# Patient Record
Sex: Male | Born: 1975 | Race: Asian | Hispanic: No | Marital: Married | Smoking: Never smoker
Health system: Southern US, Community
[De-identification: ages and names within clinical notes are randomized; demographics above are authoritative.]

## PROBLEM LIST (undated history)

## (undated) DIAGNOSIS — I10 Essential (primary) hypertension: Secondary | ICD-10-CM

## (undated) DIAGNOSIS — K219 Gastro-esophageal reflux disease without esophagitis: Secondary | ICD-10-CM

## (undated) DIAGNOSIS — K760 Fatty (change of) liver, not elsewhere classified: Secondary | ICD-10-CM

---

## 2011-03-05 ENCOUNTER — Ambulatory Visit (INDEPENDENT_AMBULATORY_CARE_PROVIDER_SITE_OTHER): Payer: BC Managed Care – PPO

## 2011-03-05 DIAGNOSIS — N476 Balanoposthitis: Secondary | ICD-10-CM

## 2011-03-05 DIAGNOSIS — I10 Essential (primary) hypertension: Secondary | ICD-10-CM

## 2011-06-30 ENCOUNTER — Other Ambulatory Visit: Payer: Self-pay | Admitting: Family Medicine

## 2011-09-02 ENCOUNTER — Other Ambulatory Visit: Payer: Self-pay | Admitting: Physician Assistant

## 2011-11-02 ENCOUNTER — Emergency Department (HOSPITAL_COMMUNITY): Payer: BC Managed Care – PPO

## 2011-11-02 ENCOUNTER — Encounter (HOSPITAL_COMMUNITY): Payer: Self-pay | Admitting: *Deleted

## 2011-11-02 ENCOUNTER — Observation Stay (HOSPITAL_COMMUNITY)
Admission: EM | Admit: 2011-11-02 | Discharge: 2011-11-03 | Disposition: A | Discharge status: Home / Self Care | Payer: BC Managed Care – PPO | Attending: Cardiovascular Disease | Admitting: Cardiovascular Disease

## 2011-11-02 ENCOUNTER — Ambulatory Visit (INDEPENDENT_AMBULATORY_CARE_PROVIDER_SITE_OTHER): Payer: BC Managed Care – PPO | Admitting: Emergency Medicine

## 2011-11-02 VITALS — BP 158/100 | HR 69 | Temp 97.8°F | Resp 18 | Ht 60.0 in | Wt 142.0 lb

## 2011-11-02 DIAGNOSIS — I10 Essential (primary) hypertension: Secondary | ICD-10-CM | POA: Insufficient documentation

## 2011-11-02 DIAGNOSIS — R0602 Shortness of breath: Secondary | ICD-10-CM | POA: Insufficient documentation

## 2011-11-02 DIAGNOSIS — K219 Gastro-esophageal reflux disease without esophagitis: Secondary | ICD-10-CM | POA: Diagnosis present

## 2011-11-02 DIAGNOSIS — R079 Chest pain, unspecified: Secondary | ICD-10-CM

## 2011-11-02 DIAGNOSIS — K760 Fatty (change of) liver, not elsewhere classified: Secondary | ICD-10-CM | POA: Diagnosis present

## 2011-11-02 DIAGNOSIS — I200 Unstable angina: Secondary | ICD-10-CM

## 2011-11-02 DIAGNOSIS — K7689 Other specified diseases of liver: Secondary | ICD-10-CM | POA: Insufficient documentation

## 2011-11-02 HISTORY — DX: Essential (primary) hypertension: I10

## 2011-11-02 HISTORY — DX: Gastro-esophageal reflux disease without esophagitis: K21.9

## 2011-11-02 HISTORY — DX: Fatty (change of) liver, not elsewhere classified: K76.0

## 2011-11-02 LAB — CBC WITH DIFFERENTIAL/PLATELET
Lymphocytes Relative: 22 % (ref 12–46)
Lymphs Abs: 1.8 10*3/uL (ref 0.7–4.0)
MCV: 79.8 fL (ref 78.0–100.0)
Neutro Abs: 5.4 10*3/uL (ref 1.7–7.7)
Neutrophils Relative %: 66 % (ref 43–77)
Platelets: 233 10*3/uL (ref 150–400)
RBC: 5.14 MIL/uL (ref 4.22–5.81)
WBC: 8.2 10*3/uL (ref 4.0–10.5)

## 2011-11-02 LAB — CK TOTAL AND CKMB (NOT AT ARMC)
CK, MB: 2.8 ng/mL (ref 0.3–4.0)
CK, MB: 2.4 ng/mL (ref 0.3–4.0)
Relative Index: INVALID (ref 0.0–2.5)
Total CK: 97 U/L (ref 7–232)
Total CK: 86 U/L (ref 7–232)

## 2011-11-02 LAB — BASIC METABOLIC PANEL
CO2: 26 mEq/L (ref 19–32)
Chloride: 105 mEq/L (ref 96–112)
Glucose, Bld: 94 mg/dL (ref 70–99)
Potassium: 3.7 mEq/L (ref 3.5–5.1)
Sodium: 140 mEq/L (ref 135–145)

## 2011-11-02 LAB — HEPATIC FUNCTION PANEL
ALT: 19 U/L (ref 0–53)
Albumin: 4 g/dL (ref 3.5–5.2)
Alkaline Phosphatase: 65 U/L (ref 39–117)
Total Protein: 7.4 g/dL (ref 6.0–8.3)

## 2011-11-02 LAB — LIPASE, BLOOD: Lipase: 18 U/L (ref 11–59)

## 2011-11-02 LAB — TROPONIN I
Troponin I: <0.3 ng/mL (ref <0.30)
Troponin I: <0.3 ng/mL (ref <0.30)

## 2011-11-02 LAB — PROTIME-INR: INR: 1.04 (ref 0.00–1.49)

## 2011-11-02 MED ORDER — NITROGLYCERIN 0.3 MG SL SUBL
0.4000 mg | SUBLINGUAL_TABLET | SUBLINGUAL | Status: AC | PRN
  Administered 2011-11-02 (×2): 0.3 mg via SUBLINGUAL

## 2011-11-02 MED ORDER — PANTOPRAZOLE SODIUM 40 MG PO TBEC
40.0000 mg | DELAYED_RELEASE_TABLET | Freq: Two times a day (BID) | ORAL | Status: DC
  Administered 2011-11-02 – 2011-11-03 (×2): 40 mg via ORAL
  Filled 2011-11-02 (×2): qty 1

## 2011-11-02 MED ORDER — ACETAMINOPHEN 325 MG PO TABS: 650.0000 mg | ORAL_TABLET | ORAL | Status: DC | PRN

## 2011-11-02 MED ORDER — ASPIRIN EC 81 MG PO TBEC
81.0000 mg | DELAYED_RELEASE_TABLET | Freq: Every day | ORAL | Status: DC
  Administered 2011-11-03: 81 mg via ORAL
  Filled 2011-11-02: qty 1

## 2011-11-02 MED ORDER — TRAMADOL HCL 50 MG PO TABS: 50.0000 mg | ORAL_TABLET | Freq: Four times a day (QID) | ORAL | Status: DC | PRN

## 2011-11-02 MED ORDER — SODIUM CHLORIDE 0.9 % IV SOLN
INTRAVENOUS | Status: DC
  Administered 2011-11-02: 15:00:00 via INTRAVENOUS

## 2011-11-02 MED ORDER — ALPRAZOLAM 0.25 MG PO TABS: 0.2500 mg | ORAL_TABLET | Freq: Three times a day (TID) | ORAL | Status: DC | PRN

## 2011-11-02 MED ORDER — ATORVASTATIN CALCIUM 10 MG PO TABS
10.0000 mg | ORAL_TABLET | Freq: Every day | ORAL | Status: DC
  Administered 2011-11-02: 10 mg via ORAL
  Filled 2011-11-02 (×3): qty 1

## 2011-11-02 MED ORDER — SODIUM CHLORIDE 0.9 % IV SOLN
INTRAVENOUS | Status: DC
  Administered 2011-11-02: 17:00:00 via INTRAVENOUS

## 2011-11-02 MED ORDER — SODIUM CHLORIDE 0.9 % IV BOLUS (SEPSIS): 1000.0000 mL | Freq: Once | INTRAVENOUS | Status: DC

## 2011-11-02 MED ORDER — METOPROLOL TARTRATE 25 MG PO TABS
25.0000 mg | ORAL_TABLET | Freq: Two times a day (BID) | ORAL | Status: DC
  Administered 2011-11-02 – 2011-11-03 (×3): 25 mg via ORAL
  Filled 2011-11-02 (×4): qty 1

## 2011-11-02 MED ORDER — MORPHINE SULFATE 4 MG/ML IJ SOLN
2.0000 mg | INTRAMUSCULAR | Status: DC | PRN
  Administered 2011-11-02: 2 mg via INTRAVENOUS
  Filled 2011-11-02: qty 1

## 2011-11-02 MED ORDER — ONDANSETRON HCL 4 MG/2ML IJ SOLN: 4.0000 mg | Freq: Four times a day (QID) | INTRAMUSCULAR | Status: DC | PRN

## 2011-11-02 MED ORDER — ZOLPIDEM TARTRATE 5 MG PO TABS: 5.0000 mg | ORAL_TABLET | Freq: Every evening | ORAL | Status: DC | PRN

## 2011-11-02 MED ORDER — NITROGLYCERIN 0.4 MG SL SUBL: 0.4000 mg | SUBLINGUAL_TABLET | SUBLINGUAL | Status: DC | PRN

## 2011-11-02 MED ORDER — IOHEXOL 350 MG/ML SOLN
100.0000 mL | Freq: Once | INTRAVENOUS | Status: AC | PRN
  Administered 2011-11-02: 100 mL via INTRAVENOUS

## 2011-11-02 MED ORDER — ASPIRIN 81 MG PO CHEW
324.0000 mg | CHEWABLE_TABLET | Freq: Once | ORAL | Status: AC
  Administered 2011-11-02: 324 mg via ORAL

## 2011-11-02 NOTE — ED Provider Notes (Signed)
History     CSN: [id number]  Arrival date & time 11/02/11  4098   First MD Initiated Contact with Patient 11/02/11 1015      Chief Complaint  Patient presents with  . Chest Pain    (Consider location/radiation/quality/duration/timing/severity/associated sxs/prior treatment) HPI  36 year-old male with hx of HTN presents for evaluation of CP.  Patient reports he was playing volleyball last night. After finish playing he was about to eat when he experiencing a tightness sensation to his left chest. He also experiencing increased shortness of breath with the chest tightness. Endorse a throbbing headache with pain radiates to both hands. Describe pain as a burning sensation. He decided to sleep it off however this morning when he woke up he still experiencing the same sensation. He went to walk and his pulse recommended to come to the urgent care for further evaluation. At the urgent care he was  found to have EKG changes and was sent to the ER for further management.  Patient reports EMS gave him aspirin and sublingual nitroglycerin which has helped his chest pain. He is currently chest pain-free. He denies having similar symptoms in the past. He is not a smoker. No significant family history of cardiac disease.  No hx of diabetes.    Past Medical History  Diagnosis Date  . Hypertension     No past surgical history on file.  No family history on file.  History  Substance Use Topics  . Smoking status: Never Smoker   . Smokeless tobacco: Not on file  . Alcohol Use: No      Review of Systems  All other systems reviewed and are negative.    Allergies  Review of patient's allergies indicates no known allergies.  Home Medications  No current outpatient prescriptions on file.  BP 158/112  Pulse 73  Temp 97.8 F (36.6 C) (Oral)  Resp 20  SpO2 98%  Physical Exam  Nursing note and vitals reviewed. Constitutional: He appears well-developed and well-nourished. No distress.       Awake, alert, nontoxic appearance  HENT:  Head: Normocephalic and atraumatic.  Eyes: Conjunctivae normal and EOM are normal. Pupils are equal, round, and reactive to light. Right eye exhibits no discharge. Left eye exhibits no discharge.  Neck: Normal range of motion. Neck supple.  Cardiovascular: Normal rate and regular rhythm.   Pulmonary/Chest: Effort normal. No respiratory distress. He exhibits no tenderness.  Abdominal: Soft. There is no tenderness. There is no rebound.  Musculoskeletal: He exhibits no edema and no tenderness.       ROM appears intact, no obvious focal weakness  Neurological: He is alert.  Skin: Skin is warm and dry. No rash noted.  Psychiatric: He has a normal mood and affect.    ED Course  Procedures (including critical care time)  Labs Reviewed - No data to display No results found.   No diagnosis found.   Date: 11/02/2011  Rate: 81  Rhythm: sinus arrhythmia  QRS Axis: normal  Intervals: normal  ST/T Wave abnormalities: ST depressions inferiorly  Conduction Disutrbances:none  Narrative Interpretation:   Old EKG Reviewed: none available  Results for orders placed during the hospital encounter of 11/02/11  CBC WITH DIFFERENTIAL      Component Value Range   WBC 8.2  4.0 - 10.5 K/uL   RBC 5.14  4.22 - 5.81 MIL/uL   Hemoglobin 13.5  13.0 - 17.0 g/dL   HCT 11.9  14.7 - 82.9 %   MCV  79.8  78.0 - 100.0 fL   MCH 26.3  26.0 - 34.0 pg   MCHC 32.9  30.0 - 36.0 g/dL   RDW 96.0  45.4 - 09.8 %   Platelets 233  150 - 400 K/uL   Neutrophils Relative 66  43 - 77 %   Neutro Abs 5.4  1.7 - 7.7 K/uL   Lymphocytes Relative 22  12 - 46 %   Lymphs Abs 1.8  0.7 - 4.0 K/uL   Monocytes Relative 8  3 - 12 %   Monocytes Absolute 0.6  0.1 - 1.0 K/uL   Eosinophils Relative 4  0 - 5 %   Eosinophils Absolute 0.3  0.0 - 0.7 K/uL   Basophils Relative 0  0 - 1 %   Basophils Absolute 0.0  0.0 - 0.1 K/uL  BASIC METABOLIC PANEL      Component Value Range   Sodium 140   135 - 145 mEq/L   Potassium 3.7  3.5 - 5.1 mEq/L   Chloride 105  96 - 112 mEq/L   CO2 26  19 - 32 mEq/L   Glucose, Bld 94  70 - 99 mg/dL   BUN 13  6 - 23 mg/dL   Creatinine, Ser 1.19  0.50 - 1.35 mg/dL   Calcium 9.2  8.4 - 14.7 mg/dL   GFR calc non Af Amer >90  >90 mL/min   GFR calc Af Amer >90  >90 mL/min  TROPONIN I      Component Value Range   Troponin I <0.30  <0.30 ng/mL   Dg Chest 2 View  11/02/2011  *RADIOLOGY REPORT*  Clinical Data: Chest pain, hypertension  CHEST - 2 VIEW  Comparison: None  Findings: Mild enlargement of cardiac silhouette. Mediastinal contours and pulmonary vascularity normal. Peribronchial thickening with subsegmental atelectasis at right middle lobe. No infiltrate, pleural effusion or pneumothorax. No acute osseous findings.  IMPRESSION: Minimal enlargement of cardiac silhouette. Bronchitic changes and right middle lobe subsegmental atelectasis.   Original Report Authenticated By: Lollie Marrow, M.D.     1. Unstable angina   MDM  Pt presents with CP concerning for ACS.  He has ST depression in the inferior leads without prior ECG for comparison.  He is currently CP free.  He is hypertensive with systolic 160s.  Work up initiated.  Discussed with my attending.  Will call for admission for cardiac rule out.    11:55 AM Pt remains symptom free.  I have spoken with unassigned cardiologist, who agrees to see pt in ED for further care. Pt has neg trop, and labs are unremarkable.     BP 158/112  Pulse 73  Temp 97.8 F (36.6 C) (Oral)  Resp 20  SpO2 98%  Nursing notes reviewed and considered in documentation  Previous records reviewed and considered  All labs/vitals reviewed and considered  xrays reviewed and considered    Fayrene Helper, PA-C 11/02/11 1248

## 2011-11-02 NOTE — Progress Notes (Signed)
   Date:  11/02/2011   Name:  Steven Sullivan   DOB:  05-15-1975   MRN:  [id number] Gender: male Age: 36 y.o.  PCP:  No primary provider on file.    Chief Complaint: Hypertension   History of Present Illness:  Steven Sullivan is a 36 y.o. pleasant patient who presents with the following:  History of hypertension.  Stopped taking his medication 3 months ago related to his move from [place] to [place].  Last night had sensation that he had difficulty breathing.  Today awoke with chest pain that he describes as like indigestion.  The pain has diminished since but is still present.  Denies prior chest pain or pressure.  Non smoker.  No family history of CAD.  Pain not radiating.  No associated symptoms of nausea, diaphoresis, palpitations, wheezing, shortness of breath.  No cough. Says this morning he experienced a diffuse headache not associated with neurological or visual symptoms.    Patient Active Problem List  Diagnosis  . Hypertension    No past medical history on file.  No past surgical history on file.  History  Substance Use Topics  . Smoking status: Never Smoker   . Smokeless tobacco: Not on file  . Alcohol Use: Not on file    No family history on file.  No Known Allergies  Medication list has been reviewed and updated.  Outpatient Prescriptions Prior to Visit  Medication Sig Dispense Refill  . lisinopril-hydrochlorothiazide (PRINZIDE,ZESTORETIC) 10-12.5 MG per tablet TAKE 1 TABLET BY MOUTH DAILY. NEEDS OFFICE VISIT/LABWORK FOR MORE  15 tablet  0    Review of Systems:  As per HPI, otherwise negative.    Physical Examination: Filed Vitals:   11/02/11 0836  BP: 160/110  Pulse: 69  Temp: 97.8 F (36.6 C)  Resp: 18   Filed Vitals:   11/02/11 0836  Height: 5' (1.524 m)  Weight: 142 lb (64.411 kg)   Body mass index is 27.73 kg/(m^2). Ideal Body Weight: Weight in (lb) to have BMI = 25: 127.7   GEN: WDWN, NAD, Non-toxic, A & O x 3 HEENT: Atraumatic,  Normocephalic. Neck supple. No masses, No LAD. Ears and Nose: No external deformity. CV: RRR, No M/G/R. No JVD. No thrill. No extra heart sounds. PULM: CTA B, no wheezes, crackles, rhonchi. No retractions. No resp. distress. No accessory muscle use. ABD: S, NT, ND, +BS. No rebound. No HSM. EXTR: No c/c/e NEURO Normal gait.  PSYCH: Normally interactive. Conversant. Not depressed or anxious appearing.  Calm demeanor.    Assessment and Plan: Chest pain Uncontrolled hypertension Noncompliance EKG ST elevation anterolateral leads Cardiac evaluation.  BP and pain resolved with NTG  Carmelina Dane, MD I have reviewed and agree with documentation. Robert P. Merla Riches, M.D.

## 2011-11-02 NOTE — H&P (Signed)
Steven Sullivan is an 36 y.o. male.    Chief Complaint: chest pain HPI: 36 year-old [place] male with hx of HTN presents for evaluation of CP. Patient reports he was playing volleyball last night. After finish playing he was about to eat when he experiencing a tightness sensation to his left chest. He also experienced significant  shortness of breath with the chest tightness. Also a throbbing headache with pain radiates to both hands. Some pain to his back.  Described pain as a hot burning sensation. He decided to sleep it off however this morning when he woke up he was still experiencing the same sensation. He went to work and his boss recommended he go to the urgent care for further evaluation. At the urgent care he was found to have EKG changes and was sent to the ER for further management. Patient reports EMS gave him aspirin and sublingual nitroglycerin which has helped his chest pain. He is currently chest pain-free. He denies having similar symptoms in the past. He is not a smoker. No significant family history of cardiac disease. No hx of diabetes  Still with mild SOB and chest tightness.  He was out of his BP meds due to change in insurance. BP on arrival 160/110.      Past Medical History  Diagnosis Date  . Hypertension   . GERD (gastroesophageal reflux disease) 11/02/2011    No past surgical history on file.  No family history on file.stated no CAD in his family. Social History:  reports that he has never smoked. He does not have any smokeless tobacco history on file. He reports that he does not drink alcohol or use illicit drugs.  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  Allergies: No Known Allergies  OUT PATIENT MEDICATIONS: Had run out of BP Meds due to change in job and insurance.  Results for orders placed during the hospital encounter of 11/02/11 (from the past 48 hour(s))  CBC WITH DIFFERENTIAL     Status: Normal   Collection Time   11/02/11 10:35 AM      Component Value Range Comment   WBC 8.2  4.0 - 10.5 K/uL    RBC 5.14  4.22 - 5.81 MIL/uL    Hemoglobin 13.5  13.0 - 17.0 g/dL    HCT 16.1  09.6 - 04.5 %    MCV 79.8  78.0 - 100.0 fL    MCH 26.3  26.0 - 34.0 pg    MCHC 32.9  30.0 - 36.0 g/dL    RDW 40.9  81.1 - 91.4 %    Platelets 233  150 - 400 K/uL    Neutrophils Relative 66  43 - 77 %    Neutro Abs 5.4  1.7 - 7.7 K/uL    Lymphocytes Relative 22  12 - 46 %    Lymphs Abs 1.8  0.7 - 4.0 K/uL    Monocytes Relative 8  3 - 12 %    Monocytes Absolute 0.6  0.1 - 1.0 K/uL    Eosinophils Relative 4  0 - 5 %    Eosinophils Absolute 0.3  0.0 - 0.7 K/uL    Basophils Relative 0  0 - 1 %    Basophils Absolute 0.0  0.0 - 0.1 K/uL   BASIC METABOLIC PANEL     Status: Normal   Collection Time   11/02/11 10:35 AM      Component Value Range Comment   Sodium 140  135 - 145 mEq/L    Potassium 3.7  3.5 - 5.1 mEq/L    Chloride 105  96 - 112 mEq/L    CO2 26  19 - 32 mEq/L    Glucose, Bld 94  70 - 99 mg/dL    BUN 13  6 - 23 mg/dL    Creatinine, Ser 4.09  0.50 - 1.35 mg/dL    Calcium 9.2  8.4 - 81.1 mg/dL    GFR calc non Af Amer >90  >90 mL/min    GFR calc Af Amer >90  >90 mL/min   TROPONIN I     Status: Normal   Collection Time   11/02/11 10:35 AM      Component Value Range Comment   Troponin I <0.30  <0.30 ng/mL    Dg Chest 2 View  11/02/2011  *RADIOLOGY REPORT*  Clinical Data: Chest pain, hypertension  CHEST - 2 VIEW  Comparison: None  Findings: Mild enlargement of cardiac silhouette. Mediastinal contours and pulmonary vascularity normal. Peribronchial thickening with subsegmental atelectasis at right middle lobe. No infiltrate, pleural effusion or pneumothorax. No acute osseous findings.  IMPRESSION: Minimal enlargement of cardiac silhouette. Bronchitic changes and right middle lobe subsegmental atelectasis.   Original Report Authenticated By: Lollie Marrow, M.D.     ROS: General:no recent colds or fevers, had felt well  until last pm Skin:no rashes or ulcers HEENT:no blurred vision CV:see HPI PUL:see HPI GI:has indigestion with spicy foods GU:no hematuria MS:no joint pain Neuro:no syncope Endo:no diabetes, no thyroid disease   Blood pressure 139/96, pulse 65, temperature 97.8 F (36.6 C), temperature source Oral, resp. rate 17, SpO2 96.00%. PE: General:alert and oriented X 3, MAE, follows commands, English is understandable Skin:Warm and dry, brisk capillary refill  HEENT:normocephalic, sclera clear Neck:supple no JVD, no carotid bruits Heart:S1S2 RRR Lungs:crackles in bases, no wheezes Abd:+ BS, soft, mild tenderness mid upper abd. Ext:no edema, 2+ pedal pulses Neuro: alert and oriented, follows commands, MAE    Assessment/Plan Active Problems:  Hypertension, uncontrolled  Chest pain  GERD (gastroesophageal reflux disease)  PLAN:  Admit to OBS, begin meds for HTN.  Rule out cardiac with cardiac enzymes, check liver enzymes. See Dr. Landry Dyke note.   INGOLD,LAURA R 11/02/2011, 1:57 PM   Patient seen and examined. Agree with assessment and plan. 36 yo [place] male  Who has a h/o HTN, but had run out of meds for time. He has developed dyspnea and chest tightness, midepigastric to anterior chest with vague arm discomfort. He presented hypertensive with stage 2 HTN. ECG without acute changes. Will check cardiac markers, chest CT, and echo. Will add low dose beta blocker and PPI with GERD.  Lennette Bihari, MD, [facility] 11/02/2011 2:16 PM

## 2011-11-02 NOTE — ED Provider Notes (Signed)
Patient moved to CDU for potential admission for cardiac workup and ACS rule out.  Cardiology remains at bedside continuing to order labs.  Patient resting comfortably, VSS, without pain at this time.  On Exam: Lungs CTA bilaterally. S1/S2, RRR, no murmur. Abdomen soft, bowel sounds present. Strong distal pulses palpated all extremities.  BP has decreased and remained acceptable while in the CDU.    2:04 PM   Pt admitted by cardiology who will continue to follow resulting labs.    Dahlia Client Abiola Behring, PA-C 11/02/11 2150

## 2011-11-02 NOTE — ED Notes (Signed)
Cards MD at bedside

## 2011-11-02 NOTE — Progress Notes (Signed)
  Echocardiogram 2D Echocardiogram has been performed.  Steven Sullivan 11/02/2011, 3:29 PM

## 2011-11-02 NOTE — ED Notes (Signed)
Pt returned from Echo and CT 

## 2011-11-02 NOTE — ED Notes (Signed)
To ED from [facility] via EMS for CP onset last night, [facility] concerned about EKG changes, none noted by EMS, 324 ASA and 3 nitros pta, VSS, NAD

## 2011-11-02 NOTE — ED Notes (Signed)
Patient transported to CT 

## 2011-11-02 NOTE — ED Provider Notes (Signed)
Medical screening examination/treatment/procedure(s) were performed by non-physician practitioner and as supervising physician I was immediately available for consultation/collaboration.   Gwyneth Sprout, MD 11/02/11 [phone]

## 2011-11-03 ENCOUNTER — Encounter (HOSPITAL_COMMUNITY): Payer: Self-pay | Admitting: Cardiology

## 2011-11-03 DIAGNOSIS — K760 Fatty (change of) liver, not elsewhere classified: Secondary | ICD-10-CM

## 2011-11-03 HISTORY — DX: Fatty (change of) liver, not elsewhere classified: K76.0

## 2011-11-03 LAB — BASIC METABOLIC PANEL
BUN: 14 mg/dL (ref 6–23)
GFR calc non Af Amer: >90 mL/min (ref >90)
Glucose, Bld: 93 mg/dL (ref 70–99)
Potassium: 3.9 mEq/L (ref 3.5–5.1)

## 2011-11-03 LAB — CBC
HCT: 41 % (ref 39.0–52.0)
Hemoglobin: 13.5 g/dL (ref 13.0–17.0)
MCH: 26.5 pg (ref 26.0–34.0)
MCHC: 32.9 g/dL (ref 30.0–36.0)

## 2011-11-03 LAB — LIPID PANEL
HDL: 40 mg/dL (ref >39)
LDL Cholesterol: 121 mg/dL — ABNORMAL HIGH (ref 0–99)

## 2011-11-03 MED ORDER — ATORVASTATIN CALCIUM 10 MG PO TABS: 10.0000 mg | ORAL_TABLET | Freq: Every day | ORAL | Status: DC

## 2011-11-03 MED ORDER — PANTOPRAZOLE SODIUM 40 MG PO TBEC: 40.0000 mg | DELAYED_RELEASE_TABLET | Freq: Two times a day (BID) | ORAL | Status: DC

## 2011-11-03 MED ORDER — LISINOPRIL 5 MG PO TABS
5.0000 mg | ORAL_TABLET | Freq: Every day | ORAL | Status: DC
  Filled 2011-11-03: qty 1

## 2011-11-03 MED ORDER — METOPROLOL TARTRATE 25 MG PO TABS: 25.0000 mg | ORAL_TABLET | Freq: Two times a day (BID) | ORAL | Status: DC

## 2011-11-03 MED ORDER — LISINOPRIL 5 MG PO TABS: 5.0000 mg | ORAL_TABLET | Freq: Every day | ORAL | Status: DC

## 2011-11-03 NOTE — Progress Notes (Signed)
Subjective: Feels better; no further chest pain.  Objective: Vital signs in last 24 hours: Temp:  [97.4 F (36.3 C)-99 F (37.2 C)] 97.4 F (36.3 C) (09/21 0335) Pulse Rate:  [58-76] 58  (09/21 0335) Resp:  [16-18] 18  (09/21 0335) BP: (125-172)/(86-110) 125/90 mmHg (09/21 0335) SpO2:  [96 %-99 %] 97 % (09/21 0335) Weight:  [64.4 kg (141 lb 15.6 oz)] 64.4 kg (141 lb 15.6 oz) (09/20 1700) Weight change:  Last BM Date: 11/02/11 Intake/Output from previous day:   Intake/Output this shift:    PE: General: NAD No JVD Heart: RRR 1/6 sem Lungs: clear WUJ:WJXB Ext:no edema    Lab Results:  Basename 11/03/11 0415 11/02/11 1035  WBC 9.0 8.2  HGB 13.5 13.5  HCT 41.0 41.0  PLT 247 233   BMET  Basename 11/03/11 0415 11/02/11 1035  NA 139 140  K 3.9 3.7  CL 105 105  CO2 25 26  GLUCOSE 93 94  BUN 14 13  CREATININE 0.83 0.72  CALCIUM 9.2 9.2    Basename 11/03/11 0107 11/02/11 1909  TROPONINI <0.30 <0.30    Lab Results  Component Value Date   CHOL 189 11/03/2011   HDL 40 11/03/2011   LDLCALC 121* 11/03/2011   TRIG 138 11/03/2011   CHOLHDL 4.7 11/03/2011   No results found for this basename: HGBA1C     Lab Results  Component Value Date   TSH 0.740 11/02/2011    Hepatic Function Panel  Basename 11/02/11 1342  PROT 7.4  ALBUMIN 4.0  AST 19  ALT 19  ALKPHOS 65  BILITOT 0.5  BILIDIR 0.1  IBILI 0.4    Basename 11/03/11 0415  CHOL 189   No results found for this basename: PROTIME in the last 72 hours    EKG: Orders placed during the hospital encounter of 11/02/11  . EKG 12-LEAD  . EKG 12-LEAD  . EKG 12-LEAD  . EKG 12-LEAD  . EKG 12-LEAD    Studies/Results: Dg Chest 2 View  11/02/2011  *RADIOLOGY REPORT*  Clinical Data: Chest pain, hypertension  CHEST - 2 VIEW  Comparison: None  Findings: Mild enlargement of cardiac silhouette. Mediastinal contours and pulmonary vascularity normal. Peribronchial thickening with subsegmental atelectasis at right  middle lobe. No infiltrate, pleural effusion or pneumothorax. No acute osseous findings.  IMPRESSION: Minimal enlargement of cardiac silhouette. Bronchitic changes and right middle lobe subsegmental atelectasis.   Original Report Authenticated By: Lollie Marrow, M.D.    Ct Angio Chest Aortic Dissect W &/or W/o  11/02/2011  *RADIOLOGY REPORT*  Clinical Data:  Mid chest pain radiating to the back.  Acute, uncontrolled hypertension.  CT ANGIOGRAPHY CHEST WITH CONTRAST  Technique:  Multidetector CT imaging of the chest was performed using the standard protocol during bolus administration of intravenous contrast.  Multiplanar CT image reconstructions including MIPs were obtained to evaluate the vascular anatomy.  Contrast: OMNIPAQUE IOHEXOL 350 MG/ML.  Comparison:   None.  Findings:  Unenhanced images demonstrate no evidence of intramural hematoma involving the thoracic or upper abdominal aorta.  No atherosclerotic calcification involving the aorta.  No visible coronary artery calcification.  No evidence of aneurysm or dissection involving the thoracic or upper abdominal aorta.  No visible atherosclerosis.  Proximal great vessels widely patent.  Celiac and SMA widely patent.  No evidence of central pulmonary embolism.  Heart mildly enlarged with left ventricular predominance and probable left ventricular hypertrophy.  No pericardial effusion.  No significant mediastinal, hilar, or axillary lymphadenopathy. Visualized inferior  thyroid gland unremarkable.  Scar/atelectasis deep in the left lower lobe.  Chronic elevation of the right hemidiaphragm with likely chronic scar/atelectasis in the right middle and lower lobes.  Dependent atelectasis posteriorly in the lower lobes, as expected.  Pulmonary parenchyma otherwise clear.  No pleural effusions.  Central airways patent with mild bronchial wall thickening.  Diffuse hepatic steatosis without focal hepatic parenchymal abnormality.  Anatomic variant in that the left  lobe of the liver extends well across the midline into the left upper quadrant. Relative enlargement the left lobe and caudate lobe of the liver. Visualized upper abdomen otherwise unremarkable for the early arterial phase enhancement.  Bone window images unremarkable.  Review of the MIP images confirms the above findings.  IMPRESSION:  1.  No evidence of thoracic aortic aneurysm or dissection. 2.  No visible coronary artery calcification. 3.  Cardiomegaly with left ventricular enlargement and left ventricular hypertrophy. 4.  Mild central bronchial wall thickening consistent with asthma and/or bronchitis which may be acute or chronic.  No acute cardiopulmonary disease otherwise. 5.  Diffuse hepatic steatosis.  Query hepatic cirrhosis.  Please correlate with laboratory data.   Original Report Authenticated By: Arnell Sieving, M.D.    2D Echo: - Left ventricle: The cavity size was normal. Systolic function was normal. The estimated ejection fraction was in the range of 55% to 60%. Although no diagnostic regional wall motion abnormality was identified, this possibility cannot be completely excluded on the basis of this study. Left ventricular diastolic function parameters were normal. - Pulmonary arteries: PA peak pressure: 35mm Hg (S).    Medications: I have reviewed the patient's current medications.    Marland Kitchen aspirin EC  81 mg Oral Daily  . atorvastatin  10 mg Oral q1800  . metoprolol tartrate  25 mg Oral BID  . pantoprazole  40 mg Oral BID AC  . DISCONTD: sodium chloride  1,000 mL Intravenous Once   Assessment/Plan: Principal Problem:  *Chest pain, negative MI, secondary to uncontrolled HTN.   Active Problems:  Hypertension, uncontrolled  GERD (gastroesophageal reflux disease)  Hepatic steatosis  PLAN: Ambulate and d/c home add ACE, continue BB and statin   LOS: 1 day   INGOLD,LAURA R 11/03/2011, 10:19 AM   Patient seen and examined. Agree with assessment and plan. Results of  echo and CT reviewed with patient and wife. BP labile; will add ACE_I to beta blocker. Statin started with increased LDL and hepatic steatosis. Will DC today. Consider outpatient exercise myoview.  Lennette Bihari, MD, [facility] 11/03/2011 10:22 AM

## 2011-11-03 NOTE — Discharge Summary (Signed)
Physician Discharge Summary  Patient ID: Terrace Fontanilla MRN: [id number] DOB/AGE: [birth year] 36 y.o.  Admit date: 11/02/2011 Discharge date: 11/03/2011  Discharge Diagnoses:  Principal Problem:  *Chest pain, negative MI, secondary to uncontrolled HTN vs. dyspepsia   Active Problems:  Hypertension, uncontrolled  GERD (gastroesophageal reflux disease)  Hepatic steatosis   Discharged Condition: good  Hospital Course: 36 year-old [place] male with hx of HTN presented to ER for evaluation of CP. Patient reports he was playing volleyball night before admit, after he finished playing he was about to eat when he experiencing a tightness sensation to his left chest. He also experienced significant shortness of breath with the chest tightness. Also a throbbing headache with pain radiates to both hands. Some pain to his back. Described pain as a hot burning sensation. He decided to sleep it off however the morning of admit when he woke up he was still experiencing the same sensation. He went to work and his boss recommended he go to the urgent care for further evaluation. At the urgent care he was found to have EKG changes and was sent to the ER for further management. Patient reports EMS gave him aspirin and sublingual nitroglycerin which has helped his chest pain. He is currently chest pain-free. He denied having similar symptoms in the past. He is not a smoker. No significant family history of cardiac disease. No hx of diabetes.  In the ER he still was with mild SOB and chest tightness. He was out of his BP meds due to change in insurance.  BP on arrival 160/110.   Cardiac markers were negative he was seen and evaluated by Dr. Tresa Endo CT angio to rule out Aortic dissection was completed. Results revealed: 1. No evidence of thoracic aortic aneurysm or dissection.  2. No visible coronary artery calcification.  3. Cardiomegaly with left ventricular enlargement and left  ventricular hypertrophy.  4. Mild  central bronchial wall thickening consistent with asthma  and/or bronchitis which may be acute or chronic. No acute  cardiopulmonary disease otherwise.  5. Diffuse hepatic steatosis. Query hepatic cirrhosis. Please  correlate with laboratory data.   Additionally 2-D echo was completed:  Study Conclusions  - Left ventricle: The cavity size was normal. Systolic function was normal. The estimated ejection fraction was in the range of 55% to 60%. Although no diagnostic regional wall motion abnormality was identified, this possibility cannot be completely excluded on the basis of this study. Left ventricular diastolic function parameters were normal. - Pulmonary arteries: PA peak pressure: 35mm Hg (S).  By the next morning he was stable without complaints.  The cardiac enzymes were negative.  He was seen and evaluated by Dr. Tresa Endo and felt ready for discharge home.  He was stable and ambulated without complications.  He was instructed to continue taking his blood pressure medications.  We also added PPI as patient has known indigestion.    Consults: None  Significant Diagnostic Studies: Cardiac enzymes negative with troponin less than 0.30  Total cholesterol 189 triglycerides 138 HDL 40 LDL 121  LFTs were normal, lipase normal at 18 amylase normal at 69  BMET    Component Value Date/Time   NA 139 11/03/2011 0415   K 3.9 11/03/2011 0415   CL 105 11/03/2011 0415   CO2 25 11/03/2011 0415   GLUCOSE 93 11/03/2011 0415   BUN 14 11/03/2011 0415   CREATININE 0.83 11/03/2011 0415   CALCIUM 9.2 11/03/2011 0415   GFRNONAA >90 11/03/2011 0415   GFRAA >90  11/03/2011 0415    CBC    Component Value Date/Time   WBC 9.0 11/03/2011 0415   RBC 5.09 11/03/2011 0415   HGB 13.5 11/03/2011 0415   HCT 41.0 11/03/2011 0415   PLT 247 11/03/2011 0415   MCV 80.6 11/03/2011 0415   MCH 26.5 11/03/2011 0415   MCHC 32.9 11/03/2011 0415   RDW 13.1 11/03/2011 0415   LYMPHSABS 1.8 11/02/2011 1035   MONOABS 0.6  11/02/2011 1035   EOSABS 0.3 11/02/2011 1035   BASOSABS 0.0 11/02/2011 1035   D-dimer 0.30  TSH 0.740   CHEST - 2 VIEW  Comparison: None  Findings:  Mild enlargement of cardiac silhouette.  Mediastinal contours and pulmonary vascularity normal.  Peribronchial thickening with subsegmental atelectasis at right  middle lobe.  No infiltrate, pleural effusion or pneumothorax.  No acute osseous findings.  IMPRESSION:  Minimal enlargement of cardiac silhouette.  Bronchitic changes and right middle lobe subsegmental atelectasis.    Discharge Exam: Blood pressure 125/90, pulse 58, temperature 97.4 F (36.3 C), temperature source Oral, resp. rate 18, height 5' (1.524 m), weight 64.4 kg (141 lb 15.6 oz), SpO2 97.00%.   PE: General: NAD  No JVD  Heart: RRR 1/6 sem  Lungs: clear  FAO:ZHYQ  Ext:no edema  Disposition: 01-Home or Self Care     Medication List     As of 11/03/2011  5:51 PM    TAKE these medications         atorvastatin 10 MG tablet   Commonly known as: LIPITOR   Take 1 tablet (10 mg total) by mouth daily at 6 PM.      lisinopril 5 MG tablet   Commonly known as: PRINIVIL,ZESTRIL   Take 1 tablet (5 mg total) by mouth daily.      metoprolol tartrate 25 MG tablet   Commonly known as: LOPRESSOR   Take 1 tablet (25 mg total) by mouth 2 (two) times daily.      pantoprazole 40 MG tablet   Commonly known as: PROTONIX   Take 1 tablet (40 mg total) by mouth 2 (two) times daily before a meal.           Follow-up Information    Follow up with Abelino Derrick, PA. On 11/14/2011. (at 1:40 pm for follow up, we will also arrange for you to have a stress test in our office)    Contact information:   [address] [place] [address] [phone]        Discharge instructions: Heart Healthy Diet with less spicy foods  Follow up   Take your medications   Signed: INGOLD,LAURA R 11/03/2011, 5:51 PM

## 2011-11-03 NOTE — Progress Notes (Signed)
Nutrition Brief Note  Patient identified on the Malnutrition Screening Tool (MST). Patient denies unintentional weight loss; reports a good appetite.  Body mass index is 27.73 kg/(m^2). Pt meets criteria for Overweight based on current BMI. Labs and medications reviewed.  No nutrition interventions warranted at this time. If nutrition issues arise, please consult RD.   Kirkland Hun, RD, LDN Pager #: [phone] After-Hours Pager #: [phone]

## 2011-11-05 NOTE — ED Provider Notes (Signed)
Medical screening examination/treatment/procedure(s) were performed by non-physician practitioner and as supervising physician I was immediately available for consultation/collaboration.    Gwyneth Sprout, MD 11/05/11 [phone]

## 2012-04-04 ENCOUNTER — Ambulatory Visit (INDEPENDENT_AMBULATORY_CARE_PROVIDER_SITE_OTHER): Payer: BC Managed Care – PPO | Admitting: Physician Assistant

## 2012-04-04 VITALS — BP 144/110 | HR 78 | Temp 97.6°F | Resp 16 | Ht 59.0 in | Wt 144.6 lb

## 2012-04-04 DIAGNOSIS — Z23 Encounter for immunization: Secondary | ICD-10-CM

## 2012-04-04 DIAGNOSIS — I10 Essential (primary) hypertension: Secondary | ICD-10-CM

## 2012-04-04 LAB — POCT UA - MICROSCOPIC ONLY
Bacteria, U Microscopic: NEGATIVE
Casts, Ur, LPF, POC: NEGATIVE
Crystals, Ur, HPF, POC: NEGATIVE
Yeast, UA: NEGATIVE

## 2012-04-04 LAB — COMPREHENSIVE METABOLIC PANEL
AST: 15 U/L (ref 0–37)
Alkaline Phosphatase: 52 U/L (ref 39–117)
BUN: 12 mg/dL (ref 6–23)
Glucose, Bld: 91 mg/dL (ref 70–99)
Sodium: 137 mEq/L (ref 135–145)
Total Bilirubin: 0.4 mg/dL (ref 0.3–1.2)

## 2012-04-04 LAB — LIPID PANEL
HDL: 36 mg/dL — ABNORMAL LOW (ref >39)
LDL Cholesterol: 146 mg/dL — ABNORMAL HIGH (ref 0–99)
Triglycerides: 166 mg/dL — ABNORMAL HIGH (ref <150)
VLDL: 33 mg/dL (ref 0–40)

## 2012-04-04 LAB — POCT URINALYSIS DIPSTICK
Blood, UA: NEGATIVE
Nitrite, UA: NEGATIVE
Protein, UA: NEGATIVE
Spec Grav, UA: 1.01
Urobilinogen, UA: 0.2

## 2012-04-04 LAB — POCT CBC
HCT, POC: 47.9 % (ref 43.5–53.7)
Hemoglobin: 15 g/dL (ref 14.1–18.1)
MCH, POC: 25.8 pg — AB (ref 27–31.2)
MCV: 82.4 fL (ref 80–97)
MID (cbc): 0.7 (ref 0–0.9)
Platelet Count, POC: 278 10*3/uL (ref 142–424)
RBC: 5.81 M/uL (ref 4.69–6.13)
WBC: 8.2 10*3/uL (ref 4.6–10.2)

## 2012-04-04 LAB — GLUCOSE, POCT (MANUAL RESULT ENTRY): POC Glucose: 69 mg/dl — AB (ref 70–99)

## 2012-04-04 NOTE — Progress Notes (Signed)
Subjective:    Patient ID: Steven Sullivan, male    DOB: September 22, 1975, 37 y.o.   MRN: [id number]  HPI This 37 y.o. male presents for evaluation of HTN.  Last seen here in 01/2012 with chest pain.  He was admitted and ruled-out for MI, and diagnosed with chest pain due to uncontrolled HTN.  He reports that he has had trouble scheduling hospital follow-up with cardiology due to his work schedule and difficulty with [place]. Ran out of his medications about 1 month ago, not realizing that he had refills x 1 year on the prescriptions he was given in 01/2012.  Denies CP, SOB, dizziness, vision change. Developed HA yesterday that persists today.  Waxes and wanes in severity.   Past Medical History  Diagnosis Date  . Hypertension   . GERD (gastroesophageal reflux disease) 11/02/2011  . Hepatic steatosis 11/03/2011    History reviewed. No pertinent past surgical history.  Prior to Admission medications   Medication Sig Start Date End Date Taking? Authorizing Provider  atorvastatin (LIPITOR) 10 MG tablet Take 1 tablet (10 mg total) by mouth daily at 6 PM. 11/03/11   Nada Boozer, NP  lisinopril (PRINIVIL,ZESTRIL) 5 MG tablet Take 1 tablet (5 mg total) by mouth daily. 11/03/11   Nada Boozer, NP  metoprolol tartrate (LOPRESSOR) 25 MG tablet Take 1 tablet (25 mg total) by mouth 2 (two) times daily. 11/03/11   Nada Boozer, NP  pantoprazole (PROTONIX) 40 MG tablet Take 1 tablet (40 mg total) by mouth 2 (two) times daily before a meal. 11/03/11   Nada Boozer, NP    No Known Allergies  History   Social History  . Marital Status: Married    Spouse Name: N/A    Number of Children: 0  . Years of Education: 5   Occupational History  . Archivist    Social History Main Topics  . Smoking status: Never Smoker   . Smokeless tobacco: Never Used  . Alcohol Use: No  . Drug Use: No  . Sexually Active: Not on file   Other Topics Concern  . Not on file   Social History Narrative   From [place].  Came to  the [place] in 01/2005.    No family history on file.  Review of Systems As above.  Denies GI/GU symptoms, myalgias, arthralgias and rash.    Objective:   Physical Exam Blood pressure 144/110, pulse 78, temperature 97.6 F (36.4 C), temperature source Oral, resp. rate 16, height 4\' 11"  (1.499 m), weight 144 lb 9.6 oz (65.59 kg), SpO2 98.00%. Body mass index is 29.19 kg/(m^2). Well-developed, well nourished [place] man who is awake, alert and oriented, in NAD. HEENT: [place]/AT, PERRL, EOMI.  Sclera and conjunctiva are clear.   Neck: supple, non-tender, no lymphadenopathy, thyromegaly. Heart: RRR, no murmur Lungs: normal effort, CTA Abdomen: normo-active bowel sounds, supple, non-tender, no mass or organomegaly. Extremities: no cyanosis, clubbing or edema. Skin: warm and dry without rash. Psychologic: good mood and appropriate affect, normal speech and behavior.   Results for orders placed in visit on 04/04/12  POCT CBC      Result Value Range   WBC 8.2  4.6 - 10.2 K/uL   Lymph, poc 2.5  0.6 - 3.4   POC LYMPH PERCENT 30.4  10 - 50 %L   MID (cbc) 0.7  0 - 0.9   POC MID % 9.1  0 - 12 %M   POC Granulocyte 5.0  2 - 6.9   Granulocyte percent 60.5  37 - 80 %G   RBC 5.81  4.69 - 6.13 M/uL   Hemoglobin 15.0  14.1 - 18.1 g/dL   HCT, POC 98.1  19.1 - 53.7 %   MCV 82.4  80 - 97 fL   MCH, POC 25.8 (*) 27 - 31.2 pg   MCHC 31.3 (*) 31.8 - 35.4 g/dL   RDW, POC 47.8     Platelet Count, POC 278  142 - 424 K/uL   MPV 8.8  0 - 99.8 fL  GLUCOSE, POCT (MANUAL RESULT ENTRY)      Result Value Range   POC Glucose 69 (*) 70 - 99 mg/dl  POCT UA - MICROSCOPIC ONLY      Result Value Range   WBC, Ur, HPF, POC neg     RBC, urine, microscopic neg     Bacteria, U Microscopic neg     Mucus, UA neg     Epithelial cells, urine per micros neg     Crystals, Ur, HPF, POC neg     Casts, Ur, LPF, POC neg     Yeast, UA neg    POCT URINALYSIS DIPSTICK      Result Value Range   Color, UA yellow     Clarity,  UA clear     Glucose, UA neg     Bilirubin, UA neg     Ketones, UA neg     Spec Grav, UA 1.010     Blood, UA neg     pH, UA 6.5     Protein, UA neg     Urobilinogen, UA 0.2     Nitrite, UA neg     Leukocytes, UA Negative        Assessment & Plan:  Hypertension - Plan: POCT CBC, POCT glucose (manual entry), POCT UA - Microscopic Only, POCT urinalysis dipstick, Comprehensive metabolic panel, Lipid panel, TSH. I will message the cardiology team with the number for this patient's niece, Gi, who speaks [place] and will help with scheduling his follow up. Stressed the importance of staying on his medications and making/keeping follow-up appointments.  Need for prophylactic vaccination and inoculation against influenza - Plan: Flu vaccine greater than or equal to 3yo preservative free IM  Need for Tdap vaccination - Plan: Tdap vaccine greater than or equal to 7yo IM

## 2012-04-04 NOTE — Patient Instructions (Addendum)
It is important that you continue your medications.  If you run out, please call the pharmacy or our office or the heart doctor so that we can refill them.

## 2012-04-05 ENCOUNTER — Encounter: Payer: Self-pay | Admitting: Physician Assistant

## 2012-05-16 ENCOUNTER — Ambulatory Visit (INDEPENDENT_AMBULATORY_CARE_PROVIDER_SITE_OTHER): Payer: BC Managed Care – PPO | Admitting: Physician Assistant

## 2012-05-16 VITALS — BP 146/100 | HR 87 | Temp 98.8°F | Resp 16 | Ht 59.5 in | Wt 146.4 lb

## 2012-05-16 DIAGNOSIS — I10 Essential (primary) hypertension: Secondary | ICD-10-CM

## 2012-05-16 NOTE — Progress Notes (Signed)
Subjective:    Patient ID: Steven Sullivan, male    DOB: 03/26/1975, 37 y.o.   MRN: [id number]  HPI This 37 y.o. male presents for evaluation of HTN.  Unfortunately, he has not been taking metoprolol, only lisinopril.  The pharmacy states they haven't filled either since 01/2012!  He denies CP, SOB, HA, dizziness.  He reports some blurred vision, and itchy watery eyes intermittently when he's trying to read.  Sometimes at work, when he's really exerting himself, he develops a cough and has pain in the LUQ just below the ribs.  If he presses there while coughing, he has no pain.   Past Medical History  Diagnosis Date  . Hypertension   . GERD (gastroesophageal reflux disease) 11/02/2011  . Hepatic steatosis 11/03/2011    No past surgical history on file.  Prior to Admission medications   Medication Sig Start Date End Date Taking? Authorizing Provider  lisinopril (PRINIVIL,ZESTRIL) 5 MG tablet Take 1 tablet (5 mg total) by mouth daily. 11/03/11  Yes Nada Boozer, NP  atorvastatin (LIPITOR) 10 MG tablet Take 1 tablet (10 mg total) by mouth daily at 6 PM. 11/03/11   Nada Boozer, NP  metoprolol tartrate (LOPRESSOR) 25 MG tablet Take 1 tablet (25 mg total) by mouth 2 (two) times daily. 11/03/11   Nada Boozer, NP  pantoprazole (PROTONIX) 40 MG tablet Take 1 tablet (40 mg total) by mouth 2 (two) times daily before a meal. 11/03/11   Nada Boozer, NP    No Known Allergies  History   Social History  . Marital Status: Married    Spouse Name: N/A    Number of Children: 0  . Years of Education: 5   Occupational History  . Archivist    Social History Main Topics  . Smoking status: Never Smoker   . Smokeless tobacco: Never Used  . Alcohol Use: No  . Drug Use: No  . Sexually Active: Not on file   Other Topics Concern  . Not on file   Social History Narrative   From [place].  Came to the [place] in 01/2005.    No family history on file.    Review of Systems As above.  No myalgias,  arthralgias, rash.    Objective:   Physical Exam  Blood pressure 146/100, pulse 87, temperature 98.8 F (37.1 C), temperature source Oral, resp. rate 16, height 4' 11.5" (1.511 m), weight 146 lb 6.4 oz (66.407 kg), SpO2 98.00%. Body mass index is 29.09 kg/(m^2). Well-developed, well nourished man who is awake, alert and oriented, in NAD. HEENT: [place]/AT, sclera and conjunctiva are clear.  Neck: supple, non-tender, no lymphadenopathy, thyromegaly. Heart: RRR, no murmur Lungs: normal effort, CTA Abdomen: normo-active bowel sounds, supple, non-tender, no mass or organomegaly. No hernia defect palpable. Extremities: no cyanosis, clubbing or edema. Skin: warm and dry without rash. Psychologic: good mood and appropriate affect, normal speech and behavior.       Assessment & Plan:  Hypertension, uncontrolled  Patient Instructions  Please make sure that you have all your medications: lisinopril, metoprolol and atorvastatin. If you do not have them, please go to the pharmacy. You need to follow the directions on the bottles (take the lisinopril one time every day; take the metoprolol two times every day-morning and night); take the atorvastatin one time every day with a meal).  For your itchy, watery eyes, use an eye drop for MOISTURE.     RTC 6 weeks.  If BP remains elevated ON his meds,  we'll add another agent.

## 2012-05-16 NOTE — Patient Instructions (Signed)
Please make sure that you have all your medications: lisinopril, metoprolol and atorvastatin. If you do not have them, please go to the pharmacy. You need to follow the directions on the bottles (take the lisinopril one time every day; take the metoprolol two times every day-morning and night); take the atorvastatin one time every day with a meal).  For your itchy, watery eyes, use an eye drop for MOISTURE.

## 2012-12-06 ENCOUNTER — Other Ambulatory Visit (HOSPITAL_COMMUNITY): Payer: Self-pay | Admitting: Cardiology

## 2012-12-08 ENCOUNTER — Ambulatory Visit (INDEPENDENT_AMBULATORY_CARE_PROVIDER_SITE_OTHER): Payer: BC Managed Care – PPO | Admitting: Family Medicine

## 2012-12-08 VITALS — BP 130/90 | HR 90 | Temp 98.6°F | Resp 16 | Ht 59.0 in | Wt 139.0 lb

## 2012-12-08 DIAGNOSIS — J069 Acute upper respiratory infection, unspecified: Secondary | ICD-10-CM

## 2012-12-08 MED ORDER — CETIRIZINE HCL 10 MG PO TABS: 10.0000 mg | ORAL_TABLET | Freq: Every day | ORAL | Status: DC

## 2012-12-08 NOTE — Telephone Encounter (Signed)
Rx was sent to pharmacy electronically. 

## 2012-12-08 NOTE — Progress Notes (Signed)
Steven Sullivan is a 37 y.o. male who presents to Urgent Care today with complaints of runny nose and congestion:  1.  URI symptoms:  Present for past 3-4 days.  Describes rhinorrhea, sinus congestion, mild cough. States that he is very congest in Left nostril.  Difficulty blowing nose on that side.  Has not tried any medications for relief.  Sick contacts are none.  No fevers or chills. No nausea or vomiting.    Also has some history of this in past.  Comes and goes this time of year usually.   PMH reviewed.  Past Medical History  Diagnosis Date  . Hypertension   . GERD (gastroesophageal reflux disease) 11/02/2011  . Hepatic steatosis 11/03/2011   History reviewed. No pertinent past surgical history.  Medications reviewed. Current Outpatient Prescriptions  Medication Sig Dispense Refill  . atorvastatin (LIPITOR) 10 MG tablet Take 1 tablet (10 mg total) by mouth daily at 6 PM.  30 tablet  11  . lisinopril (PRINIVIL,ZESTRIL) 5 MG tablet TAKE 1 TABLET (5 MG TOTAL) BY MOUTH DAILY.  30 tablet  6  . metoprolol tartrate (LOPRESSOR) 25 MG tablet Take 1 tablet (25 mg total) by mouth 2 (two) times daily.  60 tablet  11  . pantoprazole (PROTONIX) 40 MG tablet Take 1 tablet (40 mg total) by mouth 2 (two) times daily before a meal.  60 tablet  1   No current facility-administered medications for this visit.    ROS as above otherwise neg.  No chest pain, palpitations, SOB, Fever, Chills, Abd pain, N/V/D.   Physical Exam:  BP 130/90  Pulse 90  Temp(Src) 98.6 F (37 C) (Oral)  Resp 16  Ht 4\' 11"  (1.499 m)  Wt 139 lb (63.05 kg)  BMI 28.06 kg/m2  SpO2 100% Gen:  Patient sitting on exam table, appears stated age in no acute distress Head: Normocephalic atraumatic Eyes: EOMI, PERRL, sclera and conjunctiva non-erythematous Ears:  Canals clear bilaterally.  TMs pearly gray bilaterally without erythema or bulging.   Nose:  Nasal turbinates grossly enlarged bilaterally, but Left worse than Right. Some  exudates noted.Tender to palpation of maxillary sinus mildly on Left side Mouth: Mucosa membranes moist. Tonsils +2, nonenlarged, non-erythematous. Neck: No cervical lymphadenopathy noted Heart:  RRR, no murmurs auscultated. Pulm:  Clear to auscultation bilaterally with good air movement.  No wheezes or rales noted.      Assessment and Plan:  1. URI: Likely viral illness based on symptoms and history. Possibility of allergic rhinitis, has symptoms of this in past.  No signs of bacterial illness. Symptomatic treatment for now, see instructions.  Afrin and Cetirizine. Return if worsening or no improvement in 1 week.

## 2012-12-08 NOTE — Patient Instructions (Signed)
Use the Oxymetazoline (Afrin) for your nose twice a day for 3 days.  Use the Cetirizine one pill daily to help with congestion.   It was good to see you today!  If you have fevers, chills, or are not getting better come back to see [place]

## 2013-04-01 ENCOUNTER — Ambulatory Visit (INDEPENDENT_AMBULATORY_CARE_PROVIDER_SITE_OTHER): Payer: BC Managed Care – PPO | Admitting: Physician Assistant

## 2013-04-01 VITALS — BP 148/100 | HR 108 | Temp 97.8°F | Resp 18 | Ht 58.25 in | Wt 141.6 lb

## 2013-04-01 DIAGNOSIS — R509 Fever, unspecified: Secondary | ICD-10-CM

## 2013-04-01 DIAGNOSIS — R059 Cough, unspecified: Secondary | ICD-10-CM

## 2013-04-01 DIAGNOSIS — J111 Influenza due to unidentified influenza virus with other respiratory manifestations: Secondary | ICD-10-CM

## 2013-04-01 DIAGNOSIS — R51 Headache: Secondary | ICD-10-CM

## 2013-04-01 DIAGNOSIS — I10 Essential (primary) hypertension: Secondary | ICD-10-CM

## 2013-04-01 DIAGNOSIS — R05 Cough: Secondary | ICD-10-CM

## 2013-04-01 DIAGNOSIS — R69 Illness, unspecified: Principal | ICD-10-CM

## 2013-04-01 LAB — POCT CBC
Granulocyte percent: 69.9 %G (ref 37–80)
HCT, POC: 48.5 % (ref 43.5–53.7)
Hemoglobin: 15.1 g/dL (ref 14.1–18.1)
LYMPH, POC: 1.8 (ref 0.6–3.4)
MCH: 27 pg (ref 27–31.2)
MCHC: 31.1 g/dL — AB (ref 31.8–35.4)
MCV: 86.7 fL (ref 80–97)
MID (CBC): 0.8 (ref 0–0.9)
MPV: 9.1 fL (ref 0–99.8)
PLATELET COUNT, POC: 246 10*3/uL (ref 142–424)
POC Granulocyte: 6.1 (ref 2–6.9)
POC LYMPH %: 20.5 % (ref 10–50)
POC MID %: 9.6 % (ref 0–12)
RBC: 5.59 M/uL (ref 4.69–6.13)
RDW, POC: 13.9 %
WBC: 8.7 10*3/uL (ref 4.6–10.2)

## 2013-04-01 LAB — BASIC METABOLIC PANEL
BUN: 11 mg/dL (ref 6–23)
CO2: 28 mEq/L (ref 19–32)
CREATININE: 0.73 mg/dL (ref 0.50–1.35)
Calcium: 9.4 mg/dL (ref 8.4–10.5)
Chloride: 102 mEq/L (ref 96–112)
Glucose, Bld: 105 mg/dL — ABNORMAL HIGH (ref 70–99)
POTASSIUM: 3.8 meq/L (ref 3.5–5.3)
Sodium: 139 mEq/L (ref 135–145)

## 2013-04-01 LAB — POCT INFLUENZA A/B
INFLUENZA A, POC: NEGATIVE
Influenza B, POC: NEGATIVE

## 2013-04-01 MED ORDER — OSELTAMIVIR PHOSPHATE 75 MG PO CAPS: 75.0000 mg | ORAL_CAPSULE | Freq: Two times a day (BID) | ORAL | Status: DC

## 2013-04-01 MED ORDER — HYDROCOD POLST-CHLORPHEN POLST 10-8 MG/5ML PO LQCR
5.0000 mL | Freq: Two times a day (BID) | ORAL | Status: DC | PRN
Start: 2013-04-01 — End: 2013-12-10

## 2013-04-01 NOTE — Progress Notes (Signed)
Subjective:    Patient ID: Steven Sullivan, male    DOB: 29-Sep-1975, 38 y.o.   MRN: [id number]  HPI Primary Physician: No primary provider on file.  Chief Complaint: Headache, cough, sinus congestion, and chills x 1 day  HPI: 38 y.o. male with history below presents with a 1 day history of sinus congestion, headache, cough, rhinorrhea, sneezing, sore throat, fever, myalgias, and chills. Uncertain of his T max. His cough is not productive and worse at nighttime, keeping him awake. No SOB or wheezing. He complains of a generalized headache. Bilateral otalgia, right greater than left. He did get a flu vaccine in 2014.    Has been off his Lopressor 25 mg bid for about 5 months. Currently taking lisinopril 5 mg daily without issues. He denies any chest pains or headaches outside of the one associated with the above. No vision changes or focal deficits.   Past Medical History  Diagnosis Date  . Hypertension   . GERD (gastroesophageal reflux disease) 11/02/2011  . Hepatic steatosis 11/03/2011     Home Meds: Prior to Admission medications   Medication Sig Start Date End Date Taking? Authorizing Provider  lisinopril (PRINIVIL,ZESTRIL) 5 MG tablet TAKE 1 TABLET (5 MG TOTAL) BY MOUTH DAILY. 12/06/12  Yes Nada BoozerLaura Ingold, NP  atorvastatin (LIPITOR) 10 MG tablet Take 1 tablet (10 mg total) by mouth daily at 6 PM. 11/03/11  No Nada BoozerLaura Ingold, NP  cetirizine (ZYRTEC) 10 MG tablet Take 1 tablet (10 mg total) by mouth daily. 12/08/12  No Tobey GrimJeffrey H Walden, MD  metoprolol tartrate (LOPRESSOR) 25 MG tablet Take 1 tablet (25 mg total) by mouth 2 (two) times daily. 11/03/11  No Nada BoozerLaura Ingold, NP  pantoprazole (PROTONIX) 40 MG tablet Take 1 tablet (40 mg total) by mouth 2 (two) times daily before a meal. 11/03/11  No Nada BoozerLaura Ingold, NP    Allergies: No Known Allergies  History   Social History  . Marital Status: Married    Spouse Name: N/A    Number of Children: 0  . Years of Education: 5   Occupational History  .  Archivistcabinet maker    Social History Main Topics  . Smoking status: Never Smoker   . Smokeless tobacco: Never Used  . Alcohol Use: No  . Drug Use: No  . Sexual Activity: Not on file   Other Topics Concern  . Not on file   Social History Narrative   From [place].  Came to the [place] in 01/2005.     Review of Systems  Constitutional: Positive for fever, chills and fatigue. Negative for appetite change.  HENT: Positive for congestion, ear pain, rhinorrhea, sinus pressure, sneezing and sore throat. Negative for hearing loss and postnasal drip.        Right otalgia.   Respiratory: Positive for cough. Negative for shortness of breath and wheezing.        Cough is not productive. Cough is worse at nighttime, keeping him awake.   Gastrointestinal: Negative for nausea, vomiting and diarrhea.  Musculoskeletal: Positive for myalgias.  Neurological: Positive for headaches.       Generalized headache.        Objective:   Physical Exam  Physical Exam: Blood pressure 148/100, pulse 108, temperature 97.8 F (36.6 C), temperature source Oral, resp. rate 18, height 4' 10.25" (1.48 m), weight 141 lb 9.6 oz (64.229 kg), SpO2 97.00%., Body mass index is 29.32 kg/(m^2). General: Well developed, well nourished, in no acute distress. Head: Normocephalic, atraumatic, eyes without  discharge, sclera non-icteric, nares are congested. Bilateral auditory canals clear, TM's are without perforation, pearly grey with reflective cone of light bilaterally. Frontal sinus TTP. Oral cavity moist, dentition normal. Posterior pharynx with post nasal drip and mild erythema. No peritonsillar abscess or tonsillar exudate. Uvula midline.  Neck: Supple. No thyromegaly. Full ROM. No lymphadenopathy. No nuchal rigidity.  Lungs: Clear to auscultation bilaterally without wheezes, rales, or rhonchi. Breathing is unlabored.  Heart: RRR with S1 S2. No murmurs, rubs, or gallops appreciated. Msk:  Strength and tone normal for  age. Extremities: No clubbing or cyanosis. No edema. Neuro: Alert and oriented X 3. Moves all extremities spontaneously. CNII-XII grossly in tact. Psych:  Responds to questions appropriately with a normal affect.   Labs: Results for orders placed in visit on 04/01/13  POCT CBC      Result Value Ref Range   WBC 8.7  4.6 - 10.2 K/uL   Lymph, poc 1.8  0.6 - 3.4   POC LYMPH PERCENT 20.5  10 - 50 %L   MID (cbc) 0.8  0 - 0.9   POC MID % 9.6  0 - 12 %M   POC Granulocyte 6.1  2 - 6.9   Granulocyte percent 69.9  37 - 80 %G   RBC 5.59  4.69 - 6.13 M/uL   Hemoglobin 15.1  14.1 - 18.1 g/dL   HCT, POC 16.1  09.6 - 53.7 %   MCV 86.7  80 - 97 fL   MCH, POC 27.0  27 - 31.2 pg   MCHC 31.1 (*) 31.8 - 35.4 g/dL   RDW, POC 04.5     Platelet Count, POC 246  142 - 424 K/uL   MPV 9.1  0 - 99.8 fL  POCT INFLUENZA A/B      Result Value Ref Range   Influenza A, POC Negative     Influenza B, POC Negative      BMP pending    Assessment & Plan:  38 year old male with influenza-like illness and uncontrolled HTN.  1) Influenza-like illness  -Tamiflu 75 mg 1 po bid #10 no RF -Tussionex 1 tsp po q 12 hours prn cough #90 mL no RF -Tylenol/Motrin -Rest/fluids -Out of work until 04/03/13  2) Uncontrolled HTN -Restart Lopressor 25 mg bid #60 RF 5 -Continue lisinopril  -Await BMP -Healthy diet and exercise -Weight loss   Eula Listen, MHS, PA-C [facility] [address] [place], [place] [address] [phone] [facility] 04/01/2013 4:02 PM

## 2013-08-02 ENCOUNTER — Other Ambulatory Visit (HOSPITAL_COMMUNITY): Payer: Self-pay | Admitting: Cardiology

## 2013-08-04 NOTE — Telephone Encounter (Signed)
Rx refill sent to pharmacy with inst. To make appointment for further refills.

## 2013-09-08 ENCOUNTER — Other Ambulatory Visit (HOSPITAL_COMMUNITY): Payer: Self-pay | Admitting: Cardiology

## 2013-10-05 ENCOUNTER — Ambulatory Visit (INDEPENDENT_AMBULATORY_CARE_PROVIDER_SITE_OTHER): Payer: BC Managed Care – PPO | Admitting: Family Medicine

## 2013-10-05 VITALS — BP 182/102 | HR 66 | Temp 98.5°F | Resp 18 | Ht 58.5 in | Wt 146.0 lb

## 2013-10-05 DIAGNOSIS — M1711 Unilateral primary osteoarthritis, right knee: Secondary | ICD-10-CM

## 2013-10-05 DIAGNOSIS — K219 Gastro-esophageal reflux disease without esophagitis: Secondary | ICD-10-CM

## 2013-10-05 DIAGNOSIS — I10 Essential (primary) hypertension: Secondary | ICD-10-CM

## 2013-10-05 DIAGNOSIS — R51 Headache: Secondary | ICD-10-CM

## 2013-10-05 DIAGNOSIS — M1710 Unilateral primary osteoarthritis, unspecified knee: Secondary | ICD-10-CM

## 2013-10-05 DIAGNOSIS — M25569 Pain in unspecified knee: Secondary | ICD-10-CM

## 2013-10-05 DIAGNOSIS — IMO0002 Reserved for concepts with insufficient information to code with codable children: Secondary | ICD-10-CM

## 2013-10-05 DIAGNOSIS — M25561 Pain in right knee: Secondary | ICD-10-CM

## 2013-10-05 MED ORDER — METOPROLOL TARTRATE 25 MG PO TABS: 25.0000 mg | ORAL_TABLET | Freq: Two times a day (BID) | ORAL | Status: DC

## 2013-10-05 MED ORDER — LISINOPRIL-HYDROCHLOROTHIAZIDE 10-12.5 MG PO TABS: 1.0000 | ORAL_TABLET | Freq: Every day | ORAL | Status: DC

## 2013-10-05 MED ORDER — TRAMADOL HCL 50 MG PO TABS
ORAL_TABLET | ORAL | Status: DC
Start: 2013-10-05 — End: 2013-12-10

## 2013-10-05 MED ORDER — OMEPRAZOLE 40 MG PO CPDR: DELAYED_RELEASE_CAPSULE | ORAL | Status: DC

## 2013-10-05 NOTE — Progress Notes (Signed)
Subjective: Patient has been having headaches for the last few weeks, but especially bad headache today so he came to the office. He ran out of his blood pressure medicine a month ago. He did not understand that he needed to take it probably for life time he has a history of having some heartburn. He does not smoke. He intermittently gets exercise. He works doing Adult nurse. He gets a good deal of strenuous exercise with that. He has not had any edema. No breathing problems or GI or GU problems other than the heartburn. He has been having pain in his right knee again. 3 years ago I injected it helped for a long time. He would like it reinjected.  Objective: [place] American man. His parents have not had high blood pressure problems to his knowledge. His father is deceased mother lives in [place]. TMs normal. Eyes PERRLA. Throat clear. Neck supple without nodes. Chest clear. Heart regular without murmurs. Abdomen soft without mass or tenderness. Mild crepitance right knee without effusion. No ankle edema.  Assessment: Hypertension GERD Headaches secondary to uncontrolled hypertension Right knee pain DJD knee  Plan: Discussed injection of the knee with the patient. He would like it done.  Procedure note Using sterile technique the right knee was injected using a lateral approach. Chloride was used to anesthetize it. 1 cc of Depo-Medrol 80 and 0.5 cc of 2% lidocaine were injected with no difficulty. Patient tolerated this well. He was instructed in its care.  Give him some pain pills for his headaches. Put him back on the metoprolol but also giving him some lisinopril HCT since his pressures so high. Return in 3 or 4 months for recheck

## 2013-10-05 NOTE — Patient Instructions (Addendum)
Take metoprolol tartrate 5 mg one twice daily for blood pressure  Take lisinopril/HCT 10/12.5 one each morning for blood pressure  If you get near the end of your blood pressure medication and there are no refills, please come in to get checked.  Try and get some regular exercise such as walking or running or swimming or bicycling.  Try to lose some weight by learning to be less. Especially avoid too much rice, potatoes, bread, tacos, pastas, etc.  Avoid eat too much salt  Take the omeprazole one pill daily for the heartburn. If after one month you keep having a lot of heartburn, please return.   Take Tylenol 2 pills 2 or 3 times daily if needed for the knees hurting  Apply ice to the knee for about 15 minutes 2 or 3 times this evening  Return if any problems such as swelling or redness or more pain of the knee. It may hurts him tonight and tomorrow, but then should feel much better by Wednesday.

## 2013-12-10 ENCOUNTER — Ambulatory Visit (INDEPENDENT_AMBULATORY_CARE_PROVIDER_SITE_OTHER): Payer: BC Managed Care – PPO | Admitting: Family Medicine

## 2013-12-10 ENCOUNTER — Ambulatory Visit (INDEPENDENT_AMBULATORY_CARE_PROVIDER_SITE_OTHER): Payer: BC Managed Care – PPO

## 2013-12-10 VITALS — BP 110/70 | HR 82 | Temp 98.7°F | Resp 16 | Ht 59.0 in | Wt 143.0 lb

## 2013-12-10 DIAGNOSIS — R059 Cough, unspecified: Secondary | ICD-10-CM

## 2013-12-10 DIAGNOSIS — J209 Acute bronchitis, unspecified: Secondary | ICD-10-CM

## 2013-12-10 DIAGNOSIS — R05 Cough: Secondary | ICD-10-CM

## 2013-12-10 LAB — POCT CBC
Granulocyte percent: 73.6 %G (ref 37–80)
HEMATOCRIT: 43.6 % (ref 43.5–53.7)
HEMOGLOBIN: 13.4 g/dL — AB (ref 14.1–18.1)
LYMPH, POC: 2.5 (ref 0.6–3.4)
MCH: 26.1 pg — AB (ref 27–31.2)
MCHC: 30.8 g/dL — AB (ref 31.8–35.4)
MCV: 84.8 fL (ref 80–97)
MID (cbc): 0.5 (ref 0–0.9)
MPV: 7.1 fL (ref 0–99.8)
POC GRANULOCYTE: 8.3 — AB (ref 2–6.9)
POC LYMPH PERCENT: 22 %L (ref 10–50)
POC MID %: 4.4 %M (ref 0–12)
Platelet Count, POC: 252 10*3/uL (ref 142–424)
RBC: 5.14 M/uL (ref 4.69–6.13)
RDW, POC: 15 %
WBC: 11.3 10*3/uL — AB (ref 4.6–10.2)

## 2013-12-10 MED ORDER — BENZONATATE 100 MG PO CAPS: 100.0000 mg | ORAL_CAPSULE | Freq: Three times a day (TID) | ORAL | Status: DC | PRN

## 2013-12-10 MED ORDER — HYDROCODONE-HOMATROPINE 5-1.5 MG/5ML PO SYRP: 5.0000 mL | ORAL_SOLUTION | ORAL | Status: DC | PRN

## 2013-12-10 MED ORDER — AZITHROMYCIN 250 MG PO TABS
ORAL_TABLET | ORAL | Status: DC
Start: 2013-12-10 — End: 2014-11-30

## 2013-12-10 NOTE — Patient Instructions (Signed)
Drink plenty of fluids  Get enough rest.  Take the azithromycin 2 pills initially, then 1 daily for 4 days  Take the cough syrup 1 teaspoon every 4-6 hours as needed for cough. It will tend to make you sleepy, so probably best not to take it when you're going to be working.  Take the Tessalon cough pills one or 2 pills every 6 or 8 hours when needed for cough. These do not tend to cause drowsiness so can be taken when you are going to work.  Return if worse.

## 2013-12-10 NOTE — Progress Notes (Signed)
Subjective: Patient has had a bad cough since yesterday. He is coughing up some phlegm but he doesn't usually spelled out. He has been coughing very hard enough to make the chest sore. No upper respiratory symptoms or ear problems.   Objective: Coughing. TMs normal. Throat clear. Neck supple without nodes. Chest clear to auscultation. Heart regular without murmurs. His cough gets pretty intense at times.  Assessment: Cough  Plan: Chest x-ray and CBC  Results for orders placed in visit on 12/10/13  POCT CBC      Result Value Ref Range   WBC 11.3 (*) 4.6 - 10.2 K/uL   Lymph, poc 2.5  0.6 - 3.4   POC LYMPH PERCENT 22.0  10 - 50 %L   MID (cbc) 0.5  0 - 0.9   POC MID % 4.4  0 - 12 %M   POC Granulocyte 8.3 (*) 2 - 6.9   Granulocyte percent 73.6  37 - 80 %G   RBC 5.14  4.69 - 6.13 M/uL   Hemoglobin 13.4 (*) 14.1 - 18.1 g/dL   HCT, POC 14.743.6  82.943.5 - 53.7 %   MCV 84.8  80 - 97 fL   MCH, POC 26.1 (*) 27 - 31.2 pg   MCHC 30.8 (*) 31.8 - 35.4 g/dL   RDW, POC 56.215.0     Platelet Count, POC 252  142 - 424 K/uL   MPV 7.1  0 - 99.8 fL   [facility] reading (PRIMARY) by  Dr. Alwyn RenHopper No infiltrate  Assessment: Bronchitis  Plan: With the mild leukocytosis think I will go ahead and give him an antibiotic..Marland Kitchen

## 2014-10-06 ENCOUNTER — Encounter: Payer: Self-pay | Admitting: Internal Medicine

## 2014-10-06 ENCOUNTER — Encounter: Payer: Self-pay | Admitting: Cardiology

## 2014-10-08 ENCOUNTER — Other Ambulatory Visit: Payer: Self-pay | Admitting: Family Medicine

## 2014-11-30 ENCOUNTER — Ambulatory Visit (INDEPENDENT_AMBULATORY_CARE_PROVIDER_SITE_OTHER): Payer: Self-pay | Admitting: Urgent Care

## 2014-11-30 VITALS — BP 140/96 | HR 83 | Temp 98.1°F | Resp 18 | Ht 59.0 in | Wt 146.0 lb

## 2014-11-30 DIAGNOSIS — I10 Essential (primary) hypertension: Secondary | ICD-10-CM

## 2014-11-30 DIAGNOSIS — K219 Gastro-esophageal reflux disease without esophagitis: Secondary | ICD-10-CM

## 2014-11-30 DIAGNOSIS — E785 Hyperlipidemia, unspecified: Secondary | ICD-10-CM

## 2014-11-30 MED ORDER — METOPROLOL TARTRATE 25 MG PO TABS: ORAL_TABLET | ORAL | Status: DC

## 2014-11-30 MED ORDER — LISINOPRIL-HYDROCHLOROTHIAZIDE 10-12.5 MG PO TABS: ORAL_TABLET | ORAL | Status: DC

## 2014-11-30 MED ORDER — PANTOPRAZOLE SODIUM 40 MG PO TBEC: 40.0000 mg | DELAYED_RELEASE_TABLET | Freq: Two times a day (BID) | ORAL | Status: DC

## 2014-11-30 NOTE — Patient Instructions (Signed)
T?ng huy?t áp °(Hypertension) °T?ng huy?t áp, th??ng ???c g?i là huy?t áp cao, là khi l?c b?m máu qua ??ng m?ch c?a quý v? quá m?nh. ??ng m?ch c?a quý v? là các m?ch máu mang máu t? tim ?i kh?p c? th? c?a quý v?. K?t qu? ?o huy?t áp có m?t con s? cao và m?t con s? th?p, ch?ng h?n nh? 110/72. Con s? cao (tâm thu) là áp l?c bên trong ??ng m?ch khi tim quý v? b?m. Con s? th?p (tâm tr??ng) là áp l?c bên trong ??ng m?ch khi tim quý v? giãn ra. Huy?t áp lý t??ng c?n cho quý v? ph?i d??i 120/80. °Ch?ng t?ng huy?t áp bu?c tim quý v? ph?i làm vi?c v?t v? h?n ?? b?m máu. ??ng m?ch c?a quý v? có th? b? h?p ho?c c?ng. Huy?t áp cao không ???c ?i?u tr? ho?c không ???c ki?m soát có th? d?n t?i nh?i máu c? tim, ??t qu?, b?nh th?n và nh?ng v?n ?? khác. °CÁC Y?U T? NGUY C? °M?t s? y?u t? nguy c? d?n ??n huy?t áp cao có th? ki?m soát ???c. M?t s? y?u t? khác thì không.  °Nh?ng y?u t? nguy c? không th? ki?m soát ???c bao g?m:  °· Ch?ng t?c. Quý v? có nguy c? cao h?n n?u quý v? là ng??i M? g?c Phi. °· ?? tu?i. Nguy c? t?ng lên theo ?? tu?i. °· Gi?i tính. Nam gi?i có nguy c? cao h?n ph? n? tr??c tu?i 45. Sau tu?i 65, ph? n? có nguy c? cao h?n nam gi?i. °Nh?ng y?u t? nguy c? có th? ki?m soát ???c bao g?m: °· Không t?p th? d?c ho?c các ho?t ??ng th? ch?t ??y ??. °· Th?a cân. °· ?n quá nhi?u ch?t béo, ???ng, ca-lo, ho?c mu?i. °· U?ng quá nhi?u r??u. °D?U HI?U VÀ TRI?U CH?NG °T?ng huy?t áp th??ng không gây ra d?u hi?u ho?c tri?u ch?ng. Huy?t áp r?t cao (c?n cao huy?t áp) có th? gây ?au ??u, lo l?ng, khó th? và ch?y máu cam. °CH?N ?OÁN °?? ki?m tra xem quý v? có t?ng huy?t áp không, chuyên gia ch?m sóc s?c kh?e c?a quý v? s? ?o huy?t áp trong khi quý v? ng?i ??t tay ? m?c ngang v?i tim. Huy?t áp c?n ???c ?o ít nh?t hai l?n trên cùng m?t cánh tay. M?t s? tình tr?ng nh?t ??nh có th? làm cho huy?t áp khác nhau gi?a tay ph?i và tay trái c?a quý v?. K?t qu? ?o huy?t áp cao h?n bình th??ng ? m?t th?i ?i?m nào ?ó không có ngh?a là quý v? c?n ?i?u  tr?. N?u không rõ li?u quý v? có huy?t áp cao hay không, quý v? có th? ???c ?? ngh? tr? l?i vào m?t ngày khác ?? ki?m tra l?i huy?t áp. Ho?c quý v? có th? ???c yêu c?u theo dõi huy?t áp ? nhà trong 1 tu?n ho?c h?n. °?I?U TR? °?i?u tr? huy?t áp cao gao g?m thay ??i l?i s?ng và có th? ph?i dùng thu?c. Có m?t l?i s?ng lành m?nh có th? giúp làm gi?m huy?t áp cao. Quý v? có th? c?n thay ??i m?t s? thói quen. °Thay ??i l?i s?ng có th? bao g?m: °· Th?c hi?n ch? ?? ?n DASH. Ch? ?? ?n này có nhi?u trái cây, rau và ng? c?c nguyên h?t. Có ít mu?i, th?t ??, và ít b? sung ???ng. °· Duy trì l??ng mu?i tiêu th? d??i 2.300 mg m?i ngày. °· T?p aerobic ít nh?t 30-45 phút ít   nh?t 4 l?n m?i tu?n.  Gi?m cn n?u c?n thi?t.  Khng ht thu?c.  H?n ch? ?? u?ng c c?n.  H?c cc cch gi?m c?ng th?ng. Chuyn gia ch?m [place] s?c kh?e c th? k ??n thu?c n?u thay ??i l?i s?ng khng ?? ?? ??a huy?t p v? m?c c th? ki?m sot ???c v n?u m?t trong nh?ng ?i?u sau l ?ng:  Qu v? t? 18-59 tu?i v huy?t p tm thu c?a qu v? trn 140.  Qu v? t? 60 tu?i tr? ln v huy?t p tm thu c?a qu v? trn 150.  Huy?t p tm tr??ng c?a qu v? trn 90.  Qu v? b? ti?u ???ng v huy?t p tm thu c?a qu v? trn 140 ho?c huy?t p tm tr??ng c?a qu v? trn 90.  Qu v? b? b?nh th?n v huy?t p qu v? trn 140/90.  Qu v? b? b?nh tim v huy?t p qu v? trn 140/90. Huy?t p m?c tiu c nhn c?a qu v? c th? khc nhau ty thu?c v tnh tr?ng b?nh l, tu?i v cc nhn t? khc. H??NG D?N CH?M [place] T?I NH  Ki?m tra l?i huy?t p c?a qu v? theo ch? d?n c?a chuyn gia ch?m [place] s?c kh?e.  Ch? s? d?ng thu?c theo ch? d?n c?a chuyn gia ch?m [place] s?c kh?e. Lm theo ch? d?n m?t cch c?n th?n. Thu?c ?i?u tr? huy?t p ph?i ???c dng theo ??n ? k. Thu?c c?ng s? khng c tc d?ng khi qu v? b? li?u. Vi?c b? li?u thu?c c?ng lm qu v? c nguy c? pht sinh v?n ??Imagene Sheller ht thu?c.  Theo di huy?t p c?a qu v? ? nh theo ch? d?n c?a chuyn gia ch?m  [place] s?c kh?e. ?I KHM N?U:   Qu v? ngh? qu v? c ph?n ?ng v?i thu?c ?ang dng.  Qu v? b? ?au ??u ho?c c?m th?y chng m?t ti di?n.  Qu v? b? s?ng ph ? m?t c chn.  Qu v? c v?n ?? v? th? l?c. NGAY L?P T?C ?I KHM N?U:  Qu v? b? ?au ??u n?ng ho?c l l?n.  Qu v? b? y?u b?t th??ng, t b, ho?c c?m th?y nh? ng?t x?u.  Qu v? b? ?au ng?c ho?c ?au b?ng r?t nhi?u.  Qu v? nn nhi?u l?n.  Qu v? b? kh th?. ??M B?O QU V?:   Hi?u r cc h??ng d?n ny.  S? theo di tnh tr?ng c?a mnh.  S? yu c?u tr? gip ngay l?p t?c n?u qu v? c?m th?y khng kh?e ho?c th?y tr?m tr?ng h?n.   Thng tin ny khng nh?m m?c ?ch thay th? cho l?i khuyn m chuyn gia ch?m [place] s?c kh?e ni v?i qu v?. Hy b?o ??m qu v? ph?i th?o lu?n b?t k? v?n ?? g m qu v? c v?i chuyn gia ch?m [place] s?c kh?e c?a qu v?.   Document Released: 01/29/2005 Document Revised: 10/20/2014 Elsevier Interactive Patient Education 2016 Elsevier Inc.    B?nh tro ng??c d? dy th?c qu?n, Ng??i l?n (Gastroesophageal Reflux Disease, Adult) Thng th??ng, th?c ?n di chuy?n xu?ng th?c qu?n v ? l?i d? dy ?? tiu ha. Tuy nhin, khi m?t ng??i b? b?nh tro ng??c d? dy th?c qu?n (GERD), th?c ?n v a xt trong d? dy tro ng??c tr? l?i th?c qu?n. Khi tnh tr?ng ny x?y ra, th?c qu?n b? lot v vim. D?n d?n, GERD c th? t?o ra nh?ng l? nh? (v?t lot) trn l?p nim  m?c th?c qu?n.  NGUYN NHN Tnh tr?ng ny gy ra b?i m?t v?n ?? c?a ph?n c? gi?a th?c qu?n v d? dy (c? th?t th?c qu?n d??i, hay LES). Thng th??ng c? LES ?ng l?i sau khi th?c ?n ?i qua th?c qu?n vo d? dy. Khi LES b? y?u ho?c b?t th??ng, c? khng ?ng theo ?ng cch v ?i?u ? cho php th?c ?n v a xt d? dy tro ng??c tr? l?i th?c qu?n. LES c th? b? y?u do m?t s? ch?t ?n [place] nh?t ??nh, thu?c v cc tnh tr?ng b?nh l, bao g?m:  S? d?ng thu?c l.  Mang thai.  Thot v? honh.  S? d?ng nhi?u r??u.  M?t s? lo?i th?c ?n v ?? u?ng nh?t ??nh, nh? c ph,  s c la, hnh v b?c h. CC Y?U T? NGUY C? Tnh tr?ng ny hay x?y ra h?n ?:  Nh?ng ng??i t?ng cn.  Nh?ng ng??i c cc b?nh ? m lin k?t.  Nh?ng ng??i s? d?ng thu?c NSAID. TRI?U CH?NG Nh?ng tri?u ch?ng c?a tnh tr?ng ny bao g?m:  ? nng.  Kh nu?t ho?c ?au khi nu?t.  C?m th?y nh? c m?t kh?i c?c trong c? h?ng.  C?m gic ??ng trong mi?ng.  H?i th? hi.  C nhi?u n??c b?t.  C?m gic kh ch?u trong b?ng ho?c ch??ng b?ng.  ? h?i.  ?au ng?c.  Kh th? ho?c th? kh kh.  Ho lin t?c (m?n tnh) ho?c ho vo ban ?m.  B? h?ng l?p men r?ng.  S?t cn. Nh?ng tnh tr?ng khc nhau c th? gy ?au ng?c. B?o ??m ph?i ??n khm chuyn gia ch?m [place] s?c kh?e n?u qu v? b? ?au ng?c. CH?N ?ON Chuyn gia ch?m [place] s?c kh?e c?a qu v? s? h?i v? b?nh s? v khm th?c th? cho qu v?. ?? xc ??nh qu v? b? GERD nh? hay n?ng, chuyn gia ch?m [place] s?c kh?e c?ng c th? theo di qu v? ?p ?ng v?i vi?c ?i?u tr? nh? th? no. Qu v? c?ng c th? ph?i lm cc ki?m tra khc, bao g?m:  N?i soi ?? ki?m tra d? dy v th?c qu?n b?ng m?t camera nh?.  Ki?m tra ?o n?ng ?? a xt trong th?c qu?n c?a qu v?.  Ki?m tra ?o m?c p l?c ln th?c qu?n c?a qu v?.  Nu?t bari ho?c nu?t bari ?i?u ch?nh ?? hi?n th? hnh dng, kch th??c v ch?c n?ng c?a th?c qu?n c?a qu v?. ?I?U TR? M?c tiu c?a ?i?u tr? l gip gi?m cc tri?u ch?ng v trnh bi?n ch?ng. Vi?c ?i?u tr? b?nh ny c th? khc nhau ty thu?c m?c ?? n?ng c?a tri?u ch?ng. Chuyn gia ch?m [place] s?c kh?e c?a qu v? c th? khuy?n ngh?:  Thay ??i ch? ?? ?n.  Thu?c.  Ph?u thu?t. H??NG D?N CH?M [place] T?I NH Ch? ?? ?n  Tun th? m?t ch? ?? ?n theo khuy?n ngh? c?a chuyn gia ch?m  s?c kh?e. Vi?c ny c th? l trnh cc th?c ?n v ?? u?ng nh?:  C ph v tr (c ho?c khng c caffeine).  ?? u?ng c ch?ar??u.  ?? u?ng t?ng l?c v ?? u?ng dng trong th? thao.  ?? u?ng c ga ho?c soda.  S c la v c ca.  B?c h v h??ng v? b?c h.  T?i v  hnh.  C?i ng?a (Horseradish).  Cc th?c ?n nhi?u gia v? v a xt, bao g?m h?t tiu, b?t ?t, b?t ca ri,  gi?m, n??c s?t cay, v n??c s?t barbecue.  N??c qu? ho?c qu? h? cam qut, ch?ng h?n nh? cam, chanh v chanh l cam.  Cc th?c ?n c c chua, nh? n??c x?t ??, ?t, n??c x?t salsa, v pizza km x?t ??  Th?c ?n chin v nhi?u ch?t bo, ch?ng h?n nh? bnh rn, khoai ty chin, khoai ty rn v n??c x?t nhi?u ch?t bo.  Th?t nhi?u ch?t bo, ch?ng h?n nh? hot dog (bnh m k?p xc xch) v cc lo?i th?t ?? v tr?ng nhi?u m?, ch?ng h?n nh? th?t n?c l?ng, xc xch, gi?m bng v th?t l?n xng khi.  Nh?ng s?n ph?m b? s?a giu ch?t bo, nh? s?a nguyn kem, b? v pho mt kem.  ?n cc b?a nh?, th??ng xuyn thay v cc b?a no.  Trnh u?ng nhi?u n??c khi qu v? ?n.  Trnh ?n trong kho?ng 2-3 gi? tr??c khi ?i ng?.  Trnh n?m xu?ng ngay sau khi ?n.  Khngt?p th? d?c ngay sau khi ?n. H??ng d?n chung  Ch  ??n nh?ng thay ??i v? tri?u ch?ng c?a qu v?.  Ch? s? d?ng thu?c khng c?n k ??n v thu?c c?n k ??n theo ch? d?n c?a chuyn gia ch?m [place] s?c kh?e. Khng dng aspirin, ibuprofen, ho?c cc thu?c NSAID khc tr? khi chuyn gia ch?m [place] s?c kh?e c?a qu v? cho php.  Khng s? d?ng b?t k? s?n ph?m thu?c l no, bao g?m thu?c l d?ng ht, thu?c l d?ng nhai v thu?c l ?i?n t?. N?u qu v? c?n gip ?? ?? cai thu?c, hy h?i chuyn gia ch?m [place] s?c kh?e.  M?c qu?n o r?ng. Khng m?c ci g ch?t quanh eo m c th? t?o p l?c ln b?ng.  Nng (nng cao) ??u gi??ng c?a qu v? thm 6 inch (15 cm).  C? g?ng gi?m c?ng th?ng, ch?ng h?n nh? t?p yoga ho?c thi?n. N?u qu v? c?n gip ?? ?? gi?m c?ng th?ng, hy h?i chuyn gia ch?m [place] s?c kh?e.  N?u qu v? th?a cn, hy gi?m cn n?ng v? m?c c l?i cho s?c kh?e c?a qu v?. Hy h?i chuyn gia ch?m [place] s?c kh?e ?? ???c h??ng d?n v? m?c tiu gi?m cn an ton.  Tun th? t?t c? cc cu?c h?n khm l?i theo ch? d?n c?a chuyn gia ch?m [place] s?c kh?e. ?i?u ny c vai tr  quan tr?ng. ?I KHM N?U:  Qu v? c cc tri?u ch?ng m?i.  Qu v? b? s?t cn khng r nguyn nhn.  Qu v? b? kh nu?t ho?c b? ?au khi nu?t.  Qu v? th? kh kh ho?c ho dai d?ng.  Cc tri?u ch?ng c?a qu v? khng c?i thi?n sau khi ???c ?i?u tr?[place] Heads v? b? kh?n gi?ng. NGAY L?P T?C ?I KHM N?U:  Qu v? b? ?au ? cnh tay, c?, hm, r?ng ho?c l?ng.  Qu v? th?y ?? m? hi, chng m?t ho?c chong vng.  Qu v? b? ?au ng?c ho?c kh th?.  Qu v? nn v ch?t nn ra gi?ng nh? mu ho?c b c ph.  Qu v? b? ng?t.  Phn c?a qu v? c mu ho?c mu ?en.  Qu v? khng th? nu?t, u?ng hay ?n.   Thng tin ny khng nh?m m?c ?ch thay th? cho l?i khuyn m chuyn gia ch?m  s?c kh?e ni v?i qu v?. Hy b?o ??m qu v? ph?i th?o lu?n b?t k? v?n ?? g m qu v? c v?i chuyn gia ch?m  [place] s?c kh?e c?a qu v?.   Document Released: 11/08/2004 Document Revised: 10/20/2014 Elsevier Interactive Patient Education [employer] Elsevier Inc.

## 2014-11-30 NOTE — Progress Notes (Signed)
    MRN: [id number] DOB: [birth year]  Subjective:   Steven Sullivan is a 39 y.o. male presenting for chief complaint of Medication Refill  HTN - managed with lis-HCT, metoprolol. Ran out of meds in the past 2 weeks but is otherwise compliant with his medication. Admits that he has intermittent chest pain. Was worked up by Dr. Zoila ShutterKenneth Hilty in 09/2014, cardiologist, EKG was normal, patient reports that stress test was done with normal findings. No follow-up is scheduled. Patient was told to continue blood pressure management. Patient admits that his chest pain is unchanged in nature. He is taking 2 reflux medicines, needs refills for this. Admits unhealthy diet. No exercise. Denies lightheadedness, dizziness, chronic headache, double vision, shortness of breath, heart racing, palpitations, nausea, vomiting, abdominal pain, hematuria, lower leg swelling.  GERD - managed with Protonix and Prilosec. Diet is non-compliant. Has chest pain occur randomly as above.  HL - taking atorvastatin. Does not know if he needs refills for this but would like cholesterol checked.  Denies any other aggravating or relieving factors, no other questions or concerns.  Steven Sullivan has a current medication list which includes the following prescription(s): atorvastatin, lisinopril-hydrochlorothiazide, metoprolol tartrate, omeprazole, and pantoprazole. Also has No Known Allergies.  Steven Sullivan  has a past medical history of Hypertension; GERD (gastroesophageal reflux disease) (11/02/2011); and Hepatic steatosis (11/03/2011). Also  has no past surgical history on file.  Objective:   Vitals: BP 140/96 mmHg  Pulse 83  Temp(Src) 98.1 F (36.7 C) (Oral)  Resp 18  Ht 4\' 11"  (1.499 m)  Wt 146 lb (66.225 kg)  BMI 29.47 kg/m2  SpO2 99%  Physical Exam  Constitutional: He is oriented to person, place, and time. He appears well-developed and well-nourished.  HENT:  Mouth/Throat: Oropharynx is clear and moist.  Eyes: Pupils are equal, round,  and reactive to light. Right eye exhibits no discharge. Left eye exhibits no discharge. No scleral icterus.  Neck: Normal range of motion. Neck supple. No thyromegaly present.  Cardiovascular: Normal rate, regular rhythm and intact distal pulses.  Exam reveals no gallop and no friction rub.   No murmur heard. Pulmonary/Chest: No respiratory distress. He has no wheezes. He has no rales. He exhibits no tenderness.  Abdominal: Soft. Bowel sounds are normal. He exhibits no distension and no mass. There is no tenderness.  Musculoskeletal: He exhibits no edema.  Neurological: He is alert and oriented to person, place, and time.  Skin: Skin is warm and dry. No rash noted. No erythema. No pallor.  Psychiatric: He has a normal mood and affect.   Assessment and Plan :   1. Essential hypertension - Patient without his medication. Refill today. Recheck in 6 months. Continue f/u with cardiologist. Counseled patient on signs and symptoms of ACS, patient is to call 911 if this happens. However, recent visit with cardiologist per patient is very reassuring. Consider cxr if chest pain persists.  2. Hyperlipidemia - Labs pending, refill as appropriate. This is technically not a fasting level, patient last ate ~6 hours ago. Refill as appropriate.  3. Gastroesophageal reflux disease, esophagitis presence not specified - Stop Prilosec, continue Protonix. Dietary modifications.  Steven BambergMario Min Collymore, PA-C [facility] [phone] 11/30/2014 5:42 PM

## 2014-12-01 ENCOUNTER — Telehealth: Payer: Self-pay | Admitting: Urgent Care

## 2014-12-01 LAB — LIPID PANEL
CHOL/HDL RATIO: 5.6 ratio — AB (ref <=5.0)
Cholesterol: 248 mg/dL — ABNORMAL HIGH (ref 125–200)
HDL: 44 mg/dL (ref >=40)
LDL Cholesterol: 155 mg/dL — ABNORMAL HIGH (ref <130)
Triglycerides: 244 mg/dL — ABNORMAL HIGH (ref <150)
VLDL: 49 mg/dL — AB (ref <30)

## 2014-12-01 LAB — COMPREHENSIVE METABOLIC PANEL
ALK PHOS: 66 U/L (ref 40–115)
ALT: 32 U/L (ref 9–46)
AST: 22 U/L (ref 10–40)
Albumin: 4.7 g/dL (ref 3.6–5.1)
BUN: 13 mg/dL (ref 7–25)
CO2: 28 mmol/L (ref 20–31)
CREATININE: 0.89 mg/dL (ref 0.60–1.35)
Calcium: 9.6 mg/dL (ref 8.6–10.3)
Chloride: 102 mmol/L (ref 98–110)
GLUCOSE: 83 mg/dL (ref 65–99)
POTASSIUM: 4.6 mmol/L (ref 3.5–5.3)
SODIUM: 138 mmol/L (ref 135–146)
Total Bilirubin: 0.5 mg/dL (ref 0.2–1.2)
Total Protein: 7.9 g/dL (ref 6.1–8.1)

## 2014-12-01 MED ORDER — ATORVASTATIN CALCIUM 20 MG PO TABS: 20.0000 mg | ORAL_TABLET | Freq: Every day | ORAL | Status: DC

## 2014-12-01 NOTE — Telephone Encounter (Signed)
Reported elevated cholesterol and sent prescription for Lipitor 20 mg to his pharmacy. Recommended dietary modifications and exercise. Normal Cmet. Recheck in 3-6 months.

## 2015-07-08 ENCOUNTER — Ambulatory Visit (INDEPENDENT_AMBULATORY_CARE_PROVIDER_SITE_OTHER): Payer: BLUE CROSS/BLUE SHIELD | Admitting: Physician Assistant

## 2015-07-08 VITALS — BP 169/118 | HR 95 | Temp 98.0°F | Resp 16 | Ht 59.0 in | Wt 152.4 lb

## 2015-07-08 DIAGNOSIS — R21 Rash and other nonspecific skin eruption: Secondary | ICD-10-CM

## 2015-07-08 DIAGNOSIS — I10 Essential (primary) hypertension: Secondary | ICD-10-CM | POA: Diagnosis not present

## 2015-07-08 LAB — CBC
HEMATOCRIT: 44.9 % (ref 38.5–50.0)
HEMOGLOBIN: 14.8 g/dL (ref 13.2–17.1)
MCH: 26.5 pg — AB (ref 27.0–33.0)
MCHC: 33 g/dL (ref 32.0–36.0)
MCV: 80.5 fL (ref 80.0–100.0)
MPV: 9.6 fL (ref 7.5–12.5)
Platelets: 256 10*3/uL (ref 140–400)
RBC: 5.58 MIL/uL (ref 4.20–5.80)
RDW: 13.2 % (ref 11.0–15.0)
WBC: 8.3 10*3/uL (ref 3.8–10.8)

## 2015-07-08 LAB — COMPLETE METABOLIC PANEL WITH GFR
ALBUMIN: 4.5 g/dL (ref 3.6–5.1)
ALT: 33 U/L (ref 9–46)
AST: 23 U/L (ref 10–40)
Alkaline Phosphatase: 61 U/L (ref 40–115)
BILIRUBIN TOTAL: 0.4 mg/dL (ref 0.2–1.2)
BUN: 11 mg/dL (ref 7–25)
CO2: 27 mmol/L (ref 20–31)
CREATININE: 0.9 mg/dL (ref 0.60–1.35)
Calcium: 9.3 mg/dL (ref 8.6–10.3)
Chloride: 104 mmol/L (ref 98–110)
GLUCOSE: 91 mg/dL (ref 65–99)
Potassium: 3.6 mmol/L (ref 3.5–5.3)
Sodium: 140 mmol/L (ref 135–146)
TOTAL PROTEIN: 7.4 g/dL (ref 6.1–8.1)

## 2015-07-08 LAB — TSH: TSH: 0.78 m[IU]/L (ref 0.40–4.50)

## 2015-07-08 LAB — POCT SKIN KOH: Skin KOH, POC: NEGATIVE

## 2015-07-08 LAB — POCT GLYCOSYLATED HEMOGLOBIN (HGB A1C): Hemoglobin A1C: 5.4

## 2015-07-08 MED ORDER — LISINOPRIL-HYDROCHLOROTHIAZIDE 10-12.5 MG PO TABS: ORAL_TABLET | ORAL | Status: DC

## 2015-07-08 MED ORDER — CLOTRIMAZOLE-BETAMETHASONE 1-0.05 % EX CREA: 1.0000 "application " | TOPICAL_CREAM | Freq: Two times a day (BID) | CUTANEOUS | Status: DC

## 2015-07-08 NOTE — Progress Notes (Signed)
07/08/2015 2:22 PM   DOB: Jan 04, 1976 / MRN: [id number]  SUBJECTIVE:  Steven Sullivan is a 40 y.o. male presenting for itchy rash that started 2 weeks ago and is worsening.  He has never had this before and does not complain of itching elsewhere.    He has a history of HTN and has not been taking his medication for the last month.  He denies SOB, DOE, chest pain and leg swelling today.   He has No Known Allergies.   He  has a past medical history of Hypertension; GERD (gastroesophageal reflux disease) (11/02/2011); and Hepatic steatosis (11/03/2011).    He  reports that he has never smoked. He has never used smokeless tobacco. He reports that he does not drink alcohol or use illicit drugs. He  has no sexual activity history on file. The patient  has no past surgical history on file.  His family history is not on file.  Review of Systems  Constitutional: Negative for fever and chills.  Respiratory: Negative for cough.   Cardiovascular: Negative for chest pain.  Gastrointestinal: Negative for nausea.  Skin: Positive for itching and rash.  Neurological: Negative for dizziness and headaches.    Problem list and medications reviewed and updated by myself where necessary, and exist elsewhere in the encounter.   OBJECTIVE:  BP 169/118 mmHg  Pulse 95  Temp(Src) 98 F (36.7 C) (Oral)  Resp 16  Ht 4\' 11"  (1.499 m)  Wt 152 lb 6.4 oz (69.128 kg)  BMI 30.76 kg/m2  SpO2 98%  Physical Exam  Constitutional: He is oriented to person, place, and time.  Cardiovascular: Normal rate, regular rhythm and normal heart sounds.  Exam reveals no decreased pulses.   No murmur heard. Pulmonary/Chest: Effort normal and breath sounds normal.  Neurological: He is alert and oriented to person, place, and time.  Skin: Skin is intact.       Results for orders placed or performed in visit on 07/08/15 (from the past 72 hour(s))  POCT glycosylated hemoglobin (Hb A1C)     Status: None   Collection Time: 07/08/15   2:18 PM  Result Value Ref Range   Hemoglobin A1C 5.4   POCT Skin KOH     Status: None   Collection Time: 07/08/15  2:18 PM  Result Value Ref Range   Skin KOH, POC Negative     No results found.  ASSESSMENT AND PLAN  Steven Sullivan was seen today for itching.  Diagnoses and all orders for this visit:  Rash of neck: This may be psoriasis.  His KOH is negative for fungus.  Will start a steroid cream with anifungal given the chance of a false negative.  If this returns after he runs out of cream then it is most likely psoriasis.  I have advised that he contact us for a refill of steroid cream only.   -     POCT glycosylated hemoglobin (Hb A1C) -     POCT Skin KOH  Essential hypertension, benign: He has not been taking his medication. Will get this refilled for him today.   -     clotrimazole-betamethasone (LOTRISONE) cream; Apply 1 application topically 2 (two) times daily. -     TSH -     COMPLETE METABOLIC PANEL WITH GFR -     CBC -     lisinopril-hydrochlorothiazide (PRINZIDE,ZESTORETIC) 10-12.5 MG tablet; TAKE 1 TABLET BY MOUTH DAILY.    The patient was advised to call or return to clinic if he  does not see an improvement in symptoms or to seek the care of the closest emergency department if he worsens with the above plan.   Deliah Boston, MHS, PA-C [facility] 07/08/2015 2:22 PM

## 2015-07-15 ENCOUNTER — Encounter: Payer: Self-pay | Admitting: *Deleted

## 2016-03-02 ENCOUNTER — Ambulatory Visit (INDEPENDENT_AMBULATORY_CARE_PROVIDER_SITE_OTHER): Payer: BLUE CROSS/BLUE SHIELD

## 2016-03-02 ENCOUNTER — Encounter: Payer: Self-pay | Admitting: Family Medicine

## 2016-03-02 ENCOUNTER — Encounter (HOSPITAL_COMMUNITY): Payer: Self-pay

## 2016-03-02 ENCOUNTER — Ambulatory Visit (HOSPITAL_COMMUNITY)
Admission: RE | Admit: 2016-03-02 | Discharge: 2016-03-02 | Disposition: A | Discharge status: Home / Self Care | Payer: BLUE CROSS/BLUE SHIELD | Source: Ambulatory Visit | Attending: Family Medicine | Admitting: Family Medicine

## 2016-03-02 ENCOUNTER — Ambulatory Visit (INDEPENDENT_AMBULATORY_CARE_PROVIDER_SITE_OTHER): Payer: BLUE CROSS/BLUE SHIELD | Admitting: Family Medicine

## 2016-03-02 VITALS — BP 122/76 | HR 106 | Temp 99.3°F | Resp 16 | Ht 60.0 in | Wt 149.0 lb

## 2016-03-02 DIAGNOSIS — R109 Unspecified abdominal pain: Secondary | ICD-10-CM | POA: Diagnosis present

## 2016-03-02 DIAGNOSIS — R935 Abnormal findings on diagnostic imaging of other abdominal regions, including retroperitoneum: Secondary | ICD-10-CM | POA: Insufficient documentation

## 2016-03-02 DIAGNOSIS — R7303 Prediabetes: Secondary | ICD-10-CM

## 2016-03-02 LAB — POC MICROSCOPIC URINALYSIS (UMFC): MUCUS RE: ABSENT

## 2016-03-02 LAB — POCT CBC
Granulocyte percent: 86.8 %G — AB (ref 37–80)
HEMATOCRIT: 42.9 % — AB (ref 43.5–53.7)
Hemoglobin: 15 g/dL (ref 14.1–18.1)
LYMPH, POC: 1.1 (ref 0.6–3.4)
MCH, POC: 27.4 pg (ref 27–31.2)
MCHC: 34.9 g/dL (ref 31.8–35.4)
MCV: 78.4 fL — AB (ref 80–97)
MID (CBC): 0.8 (ref 0–0.9)
MPV: 7.1 fL (ref 0–99.8)
POC GRANULOCYTE: 12.5 — AB (ref 2–6.9)
POC LYMPH %: 7.7 % — AB (ref 10–50)
POC MID %: 5.5 % (ref 0–12)
Platelet Count, POC: 230 10*3/uL (ref 142–424)
RBC: 5.47 M/uL (ref 4.69–6.13)
RDW, POC: 12.6 %
WBC: 14.4 10*3/uL — AB (ref 4.6–10.2)

## 2016-03-02 LAB — POCT URINALYSIS DIP (MANUAL ENTRY)
Bilirubin, UA: NEGATIVE
Glucose, UA: NEGATIVE
Ketones, POC UA: NEGATIVE
LEUKOCYTES UA: NEGATIVE
NITRITE UA: NEGATIVE
PH UA: 6.5
PROTEIN UA: NEGATIVE
Spec Grav, UA: 1.025
UROBILINOGEN UA: 0.2

## 2016-03-02 LAB — POCT INFLUENZA A/B
INFLUENZA A, POC: NEGATIVE
Influenza B, POC: NEGATIVE

## 2016-03-02 MED ORDER — IOPAMIDOL (ISOVUE-300) INJECTION 61%
INTRAVENOUS | Status: AC
  Filled 2016-03-02: qty 30

## 2016-03-02 MED ORDER — IOPAMIDOL (ISOVUE-300) INJECTION 61%
30.0000 mL | Freq: Once | INTRAVENOUS | Status: AC | PRN
  Administered 2016-03-02: 30 mL via ORAL

## 2016-03-02 MED ORDER — OMEPRAZOLE 40 MG PO CPDR: 40.0000 mg | DELAYED_RELEASE_CAPSULE | Freq: Every day | ORAL | 3 refills | Status: DC

## 2016-03-02 MED ORDER — IOPAMIDOL (ISOVUE-300) INJECTION 61%
INTRAVENOUS | Status: AC
  Administered 2016-03-02: 100 mL
  Filled 2016-03-02: qty 100

## 2016-03-02 NOTE — Progress Notes (Signed)
Patient ID: Steven Sullivan, male    DOB: 11-08-1975, 41 y.o.   MRN: [id number]  PCP: Janace Hoard, MD  Chief Complaint  Patient presents with  . Abdominal Pain    X 3 days    Subjective:  HPI 41 year old male presents for evaluation of abdominal pain. Reports abdominal pain x 3 days. Pain is most pronounced in the lower mid epigastric region. He reports no significant diarrhea although, he has noticed a small amount lose stool over the last three days with daily bowel movement. Reports a good appetite, although food intake causes worsening abdominal pain. He reports mild headache, fatigue, chills, and generalized body aches. He has taken ibuprofen without relief symptoms.  Denies nausea or vomiting.   Social History   Social History  . Marital status: Married    Spouse name: N/A  . Number of children: 0  . Years of education: 5   Occupational History  . 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 Smoking status: Never Smoker  . Smokeless tobacco: Never Used  . Alcohol use No  . Drug use: No  . Sexual activity: Not on file   Other Topics Concern  . Not on file   Social History Narrative   From [place].  Came to the [place] in 01/2005.    No family history on file. Review of Systems See HPI Patient Active Problem List   Diagnosis Date Noted  . Hepatic steatosis 11/03/2011  . Hypertension, uncontrolled 11/02/2011  . Chest pain, negative MI, secondary to uncontrolled HTN vs. dyspepsia   11/02/2011  . GERD (gastroesophageal reflux disease) 11/02/2011    No Known Allergies  Prior to Admission medications   Medication Sig Start Date End Date Taking? Authorizing Provider  lisinopril-hydrochlorothiazide (PRINZIDE,ZESTORETIC) 10-12.5 MG tablet TAKE 1 TABLET BY MOUTH DAILY. 07/08/15  Yes Ofilia Neas, PA-C    Past Medical, Surgical Family and Social History reviewed and updated.    Objective:   Today's Vitals   03/02/16 1028  BP: 122/76  Pulse: (!)  106  Resp: 16  Temp: 99.3 F (37.4 C)  TempSrc: Oral  Weight: 149 lb (67.6 kg)  Height: 5' (1.524 m)    Wt Readings from Last 3 Encounters:  03/02/16 149 lb (67.6 kg)  07/08/15 152 lb 6.4 oz (69.1 kg)  11/30/14 146 lb (66.2 kg)   Physical Exam  Constitutional: He is oriented to person, place, and time. He appears well-developed and well-nourished.  Neck: Normal range of motion. Neck supple.  Cardiovascular: Normal rate, regular rhythm, normal heart sounds and intact distal pulses.   Pulmonary/Chest: Effort normal and breath sounds normal.  Abdominal: There is tenderness. There is rebound. There is no guarding.  Abdominal pain is mostly pronounced in lower right quadrant abdominal fe  Musculoskeletal: Normal range of motion.  Neurological: He is alert and oriented to person, place, and time.  Psychiatric: He has a normal mood and affect. His behavior is normal. Judgment and thought content normal.     Ct Abdomen Pelvis W Contrast  Result Date: 03/02/2016 CLINICAL DATA:  41 year old male with history of mid abdominal pain and right lower quadrant abdominal pain for the past 3 days with elevated white blood cell count. EXAM: CT ABDOMEN AND PELVIS WITH CONTRAST TECHNIQUE: Multidetector CT imaging of the abdomen and pelvis was performed using the standard protocol following bolus administration of intravenous contrast. CONTRAST:  30mL ISOVUE-300 IOPAMIDOL (ISOVUE-300) INJECTION 61%, 1 ISOVUE-300 IOPAMIDOL (ISOVUE-300) INJECTION 61%  COMPARISON:  No priors. FINDINGS: Lower chest: Unremarkable. Hepatobiliary: No cystic or solid hepatic lesions. No intra or extrahepatic biliary ductal dilatation. Gallbladder is normal in appearance. Pancreas: No pancreatic mass. No pancreatic ductal dilatation. No pancreatic or peripancreatic fluid or inflammatory changes. Spleen: Small amount of calcification along the lateral aspect of the spleen with a very tiny subcapsular fluid collection associated with the  spleen which appears to be chronic based on comparison with remote prior chest CT 11/02/2011, likely sequela of remote trauma. Adrenals/Urinary Tract: Focal area of scarring in the upper pole of the left kidney. Right kidney and bilateral adrenal glands are normal in appearance. No hydroureteronephrosis. Urinary bladder is normal in appearance. Stomach/Bowel: The appearance of the stomach is normal. There is no pathologic dilatation of small bowel or colon. Mural thickening in the terminal ileum. Cecum, ascending colon and proximal transverse colon are completely decompressed. The wall of the colon throughout this region may be mildly thickened and there may be some very mild hypervascularity in the associated mesocolon, although this is difficult to judge given the decompression of the colon throughout these regions. No additional areas of mural thickening or surrounding inflammatory changes are confidently identified in the small bowel or colon. Normal appendix. Vascular/Lymphatic: No significant atherosclerotic disease, aneurysm or dissection identified in the abdominal or pelvic vasculature. Multiple borderline enlarged ileocolic lymph nodes are noted, measuring up to 9 mm in short axis. No lymphadenopathy noted in the abdomen or pelvis. Reproductive: Prostate gland and seminal vesicles are normal in appearance. Other: No significant volume of ascites.  No pneumoperitoneum. Musculoskeletal: There are no aggressive appearing lytic or blastic lesions noted in the visualized portions of the skeleton. IMPRESSION: 1. Mural thickening throughout the terminal ileum with multiple borderline enlarged ileocolic lymph nodes. There is also some subjective mural thickening involving the cecum, ascending colon and proximal transverse colon, which may also demonstrates some mild hypervascularity in the associated mesocolon. These findings are nonspecific, but could be seen in the setting of inflammatory bowel disease. Clinical  correlation is suggested. Electronically Signed   By: Trudie Reedaniel  Entrikin M.D.   On: 03/02/2016 15:52   Dg Abd 2 Views  Result Date: 03/02/2016 CLINICAL DATA:  Abdominal pain with constipation EXAM: ABDOMEN - 2 VIEW COMPARISON:  None. FINDINGS: There is moderate stool in the colon. There is no bowel dilatation or air-fluid level suggesting bowel obstruction. No free air. No abnormal calcifications. Lung bases clear. IMPRESSION: No bowel obstruction or free air.  Lung bases clear. Electronically Signed   By: Bretta BangWilliam  Woodruff III M.D.   On: 03/02/2016 11:13      Assessment & Plan:  1. Abdominal pain, unspecified abdominal location - POCT Microscopic Urinalysis ([facility]) - POCT urinalysis dipstick  Plan: CT of abdomen indicated some changes within the colon which could be consistent with inflammatory bowel disease. No acute process was noted involving the pancreas, gallbladder, or appendix. CT impression also notes multiple borderline enlarged ileocolic lymph nodes. I am referring the patient emergently to Gastroenterology for further evaluation. In the meantime, I am starting Mr. Huckaba on a high dose PPI.  Advised him if symptoms worsen, to return for follow-up.  Emphasized for him to please follow-up with GI upon receiving an appointment.  Godfrey PickKimberly S. Tiburcio PeaHarris, MSN, FNP-C Primary Care at [facility] [phone]

## 2016-03-02 NOTE — Patient Instructions (Addendum)
  Go directly to [facility] to check in @ 1st floor Radiology Department  Do not eat or drink anything for four hours prior to testing    IF you received an x-ray today, you will receive an invoice from [facility]. Please contact [facility] at [phone] with questions or concerns regarding your invoice.   IF you received labwork today, you will receive an invoice from [facility]. Please contact [facility] at [phone] with questions or concerns regarding your invoice.   Our billing staff will not be able to assist you with questions regarding bills from these companies.  You will be contacted with the lab results as soon as they are available. The fastest way to get your results is to activate your My Chart account. Instructions are located on the last page of this paperwork. If you have not heard from us regarding the results in 2 weeks, please contact this office.

## 2016-03-03 LAB — CBC WITH DIFFERENTIAL/PLATELET
BASOS ABS: 0 10*3/uL (ref 0.0–0.2)
Basos: 0 %
EOS (ABSOLUTE): 0.1 10*3/uL (ref 0.0–0.4)
Eos: 1 %
Hematocrit: 43.9 % (ref 37.5–51.0)
Hemoglobin: 14.8 g/dL (ref 13.0–17.7)
Immature Grans (Abs): 0 10*3/uL (ref 0.0–0.1)
Immature Granulocytes: 0 %
LYMPHS ABS: 1.1 10*3/uL (ref 0.7–3.1)
Lymphs: 8 %
MCH: 27 pg (ref 26.6–33.0)
MCHC: 33.7 g/dL (ref 31.5–35.7)
MCV: 80 fL (ref 79–97)
Monocytes Absolute: 1.2 10*3/uL — ABNORMAL HIGH (ref 0.1–0.9)
Monocytes: 9 %
NEUTROS ABS: 10.9 10*3/uL — AB (ref 1.4–7.0)
Neutrophils: 82 %
PLATELETS: 260 10*3/uL (ref 150–379)
RBC: 5.48 x10E6/uL (ref 4.14–5.80)
RDW: 13.6 % (ref 12.3–15.4)
WBC: 13.4 10*3/uL — AB (ref 3.4–10.8)

## 2016-03-03 LAB — COMPREHENSIVE METABOLIC PANEL
ALK PHOS: 104 IU/L (ref 39–117)
ALT: 56 IU/L — AB (ref 0–44)
AST: 44 IU/L — AB (ref 0–40)
Albumin/Globulin Ratio: 1.3 (ref 1.2–2.2)
Albumin: 4.8 g/dL (ref 3.5–5.5)
BILIRUBIN TOTAL: 0.3 mg/dL (ref 0.0–1.2)
BUN/Creatinine Ratio: 16 (ref 9–20)
BUN: 14 mg/dL (ref 6–24)
CO2: 23 mmol/L (ref 18–29)
Calcium: 9.3 mg/dL (ref 8.7–10.2)
Chloride: 95 mmol/L — ABNORMAL LOW (ref 96–106)
Creatinine, Ser: 0.86 mg/dL (ref 0.76–1.27)
GFR calc Af Amer: 124 mL/min/{1.73_m2} (ref >59)
GFR calc non Af Amer: 107 mL/min/{1.73_m2} (ref >59)
GLUCOSE: 120 mg/dL — AB (ref 65–99)
Globulin, Total: 3.8 g/dL (ref 1.5–4.5)
Potassium: 4.2 mmol/L (ref 3.5–5.2)
Sodium: 137 mmol/L (ref 134–144)
Total Protein: 8.6 g/dL — ABNORMAL HIGH (ref 6.0–8.5)

## 2016-03-03 LAB — LIPASE: LIPASE: 30 U/L (ref 13–78)

## 2016-03-03 LAB — AMYLASE: AMYLASE: 77 U/L (ref 31–124)

## 2016-03-05 ENCOUNTER — Encounter: Payer: Self-pay | Admitting: Family Medicine

## 2016-03-05 NOTE — Addendum Note (Signed)
Addended by: Bing NeighborsHARRIS, Visente Kirker S on: 03/05/2016 12:58 PM   Modules accepted: Orders

## 2016-03-09 ENCOUNTER — Encounter: Payer: Self-pay | Admitting: Gastroenterology

## 2016-03-09 ENCOUNTER — Other Ambulatory Visit (INDEPENDENT_AMBULATORY_CARE_PROVIDER_SITE_OTHER): Payer: BLUE CROSS/BLUE SHIELD

## 2016-03-09 ENCOUNTER — Ambulatory Visit (INDEPENDENT_AMBULATORY_CARE_PROVIDER_SITE_OTHER): Payer: BLUE CROSS/BLUE SHIELD | Admitting: Gastroenterology

## 2016-03-09 VITALS — BP 130/78 | HR 76 | Ht 59.0 in | Wt 147.9 lb

## 2016-03-09 DIAGNOSIS — R109 Unspecified abdominal pain: Secondary | ICD-10-CM

## 2016-03-09 DIAGNOSIS — R935 Abnormal findings on diagnostic imaging of other abdominal regions, including retroperitoneum: Secondary | ICD-10-CM | POA: Diagnosis not present

## 2016-03-09 DIAGNOSIS — D72829 Elevated white blood cell count, unspecified: Secondary | ICD-10-CM | POA: Diagnosis not present

## 2016-03-09 LAB — HEMOGLOBIN A1C
Est. average glucose Bld gHb Est-mCnc: 131 mg/dL
Hgb A1c MFr Bld: 6.2 % — ABNORMAL HIGH (ref 4.8–5.6)

## 2016-03-09 LAB — CBC WITH DIFFERENTIAL/PLATELET
BASOS PCT: 0.5 % (ref 0.0–3.0)
Basophils Absolute: 0.1 10*3/uL (ref 0.0–0.1)
EOS ABS: 0.4 10*3/uL (ref 0.0–0.7)
EOS PCT: 3.8 % (ref 0.0–5.0)
HEMATOCRIT: 40.7 % (ref 39.0–52.0)
Hemoglobin: 13.7 g/dL (ref 13.0–17.0)
LYMPHS PCT: 18.4 % (ref 12.0–46.0)
Lymphs Abs: 1.9 10*3/uL (ref 0.7–4.0)
MCHC: 33.7 g/dL (ref 30.0–36.0)
MCV: 78.2 fl (ref 78.0–100.0)
MONOS PCT: 11.1 % (ref 3.0–12.0)
Monocytes Absolute: 1.1 10*3/uL — ABNORMAL HIGH (ref 0.1–1.0)
NEUTROS ABS: 6.7 10*3/uL (ref 1.4–7.7)
Neutrophils Relative %: 66.2 % (ref 43.0–77.0)
PLATELETS: 335 10*3/uL (ref 150.0–400.0)
RBC: 5.2 Mil/uL (ref 4.22–5.81)
RDW: 12.9 % (ref 11.5–15.5)
WBC: 10.1 10*3/uL (ref 4.0–10.5)

## 2016-03-09 LAB — SPECIMEN STATUS REPORT

## 2016-03-09 MED ORDER — NA SULFATE-K SULFATE-MG SULF 17.5-3.13-1.6 GM/177ML PO SOLN: 1.0000 | Freq: Once | ORAL | 0 refills | Status: AC

## 2016-03-09 NOTE — Progress Notes (Signed)
     03/09/2016 Kathe MarinerKaih Soberano [id number] Jun 25, 1975   HISTORY OF PRESENT ILLNESS:  This is a 41 year old male who is new to our office and was referred here by his PCP, Joaquin CourtsKimberly Harris, FNP, for evaluation regarding abdominal pain and an abnormal CT scan.  Patient's primary language is [place].  He had two family members here to translate, although it was still difficult to communicate his exact story/history.  It sounds like he had sudden onset of abdominal pain about 9 days ago.  Had labs that showed a leukocytosis with WBC count of 14.4.  CT scan was ordered and showed the following:  IMPRESSION: 1. Mural thickening throughout the terminal ileum with multiple borderline enlarged ileocolic lymph nodes. There is also some subjective mural thickening involving the cecum, ascending colon and proximal transverse colon, which may also demonstrates some mild hypervascularity in the associated mesocolon. These findings are nonspecific, but could be seen in the setting of inflammatory bowel disease. Clinical correlation is suggested.  It sounds like this was all fairly sudden with no complaints of chronic abdominal pain.  No diarrhea.  Sometimes hard stools.  No nausea or vomiting.  No fever.  Pain is still present but quite improved at this point.     Past Medical History:  Diagnosis Date  . GERD (gastroesophageal reflux disease) 11/02/2011  . Hepatic steatosis 11/03/2011  . Hypertension    No past surgical history on file.  reports that he has never smoked. He has never used smokeless tobacco. He reports that he does not drink alcohol or use drugs. family history is not on file. No Known Allergies    Outpatient Encounter Prescriptions as of 03/09/2016  Medication Sig  . lisinopril-hydrochlorothiazide (PRINZIDE,ZESTORETIC) 10-12.5 MG tablet TAKE 1 TABLET BY MOUTH DAILY.  Marland Kitchen. omeprazole (PRILOSEC) 40 MG capsule Take 1 capsule (40 mg total) by mouth daily.   No facility-administered  encounter medications on file as of 03/09/2016.      REVIEW OF SYSTEMS  : All other systems reviewed and negative except where noted in the History of Present Illness.   PHYSICAL EXAM: BP 130/78   Pulse 76   Ht 4\' 11"  (1.499 m)   Wt 147 lb 14.4 oz (67.1 kg)   SpO2 99%   BMI 29.87 kg/m  General: Well developed Asian male in no acute distress Head: Normocephalic and atraumatic Eyes:  Sclerae anicteric, conjunctiva pink. Ears: Normal auditory acuity Lungs: Clear throughout to auscultation Heart: Regular rate and rhythm Abdomen: Soft, non-distended.  Normal bowel sounds.  Mild lower abdominal TTP. Rectal:  Will be done at the time of colonoscopy. Musculoskeletal: Symmetrical with no gross deformities  Skin: No lesions on visible extremities Extremities: No edema  Neurological: Alert oriented x 4, grossly non-focal Psychological:  Alert and cooperative. Normal mood and affect  ASSESSMENT AND PLAN: -41 year old [place] male with sudden onset of abdominal pain, leukocytosis, and abnormal CT scan showing abnormal TI and right colon.  ? IBD vs acute infectious source.  Will repeat CBC today.  Will schedule for colonoscopy with Dr. Russella DarStark.  The risks, benefits, and alternatives to colonoscopy were discussed with the patient and he consents to proceed.   CC:  Daria PasturesHarris, Kimberly Stephe*

## 2016-03-09 NOTE — Progress Notes (Signed)
Reviewed and agree with management plan.  Masayuki Sakai T. Alexius Ellington, MD FACG 

## 2016-03-09 NOTE — Patient Instructions (Signed)
Your physician has requested that you go to the basement for the following lab work before leaving today: CBC  You have been scheduled for a colonoscopy. Please follow written instructions given to you at your visit today.  Please pick up your prep supplies at the pharmacy within the next 1-3 days. If you use inhalers (even only as needed), please bring them with you on the day of your procedure. Your physician has requested that you go to www.startemmi.com and enter the access code given to you at your visit today. This web site gives a general overview about your procedure. However, you should still follow specific instructions given to you by our office regarding your preparation for the procedure.  

## 2016-03-14 NOTE — Addendum Note (Signed)
Addended by: Bing NeighborsHARRIS, Livana Yerian S on: 03/14/2016 09:38 AM   Modules accepted: Orders

## 2016-03-22 ENCOUNTER — Ambulatory Visit (AMBULATORY_SURGERY_CENTER): Payer: BLUE CROSS/BLUE SHIELD | Admitting: Gastroenterology

## 2016-03-22 ENCOUNTER — Encounter: Payer: Self-pay | Admitting: Gastroenterology

## 2016-03-22 VITALS — BP 118/85 | HR 80 | Temp 97.8°F | Resp 16 | Ht 59.0 in | Wt 147.0 lb

## 2016-03-22 DIAGNOSIS — R935 Abnormal findings on diagnostic imaging of other abdominal regions, including retroperitoneum: Secondary | ICD-10-CM

## 2016-03-22 DIAGNOSIS — K639 Disease of intestine, unspecified: Secondary | ICD-10-CM | POA: Diagnosis not present

## 2016-03-22 MED ORDER — SODIUM CHLORIDE 0.9 % IV SOLN: 500.0000 mL | INTRAVENOUS | Status: DC

## 2016-03-22 NOTE — Progress Notes (Signed)
Interpreter used today at the [facility] for this pt.  Interpreter's name Nona Dellis-RIAN Harpham, PATIENT'S NIECE

## 2016-03-22 NOTE — Op Note (Signed)
[facility] Patient Name: Steven Sullivan Procedure Date: 03/22/2016 9:56 AM MRN: [id number] Endoscopist: Meryl DareMalcolm T Vanessa Kampf , MD Age: [age] Referring MD:  Date of Birth: February 05, 1976 Gender: Male Account #: [id number] Procedure:                Colonoscopy Indications:              Abnormal CT of the GI tract Medicines:                Monitored Anesthesia Care Procedure:                Pre-Anesthesia Assessment:                           - Prior to the procedure, a History and Physical                            was performed, and patient medications and                            allergies were reviewed. The patient's tolerance of                            previous anesthesia was also reviewed. The risks                            and benefits of the procedure and the sedation                            options and risks were discussed with the patient.                            All questions were answered, and informed consent                            was obtained. Prior Anticoagulants: The patient has                            taken no previous anticoagulant or antiplatelet                            agents. ASA Grade Assessment: II - A patient with                            mild systemic disease. After reviewing the risks                            and benefits, the patient was deemed in                            satisfactory condition to undergo the procedure.                           After obtaining informed consent, the colonoscope  was passed under direct vision. Throughout the                            procedure, the patient's blood pressure, pulse, and                            oxygen saturations were monitored continuously. The                            Model PCF-H190DL [id number]) scope was introduced                            through the anus and advanced to the the terminal                            ileum. The terminal ileum, ileocecal  valve,                            appendiceal orifice, and rectum were photographed.                            The quality of the bowel preparation was excellent.                            The colonoscopy was performed without difficulty.                            The patient tolerated the procedure well. Scope In: 10:07:20 AM Scope Out: 10:19:17 AM Scope Withdrawal Time: 0 hours 10 minutes 28 seconds  Total Procedure Duration: 0 hours 11 minutes 57 seconds  Findings:                 The perianal and digital rectal examinations were                            normal.                           A localized area of mucosa in the terminal ileum                            was granular and erythematous. Biopsies were taken                            with a cold forceps for histology.                           A localized area of mildly erythematous mucosa was                            found in the ascending colon and at the ileocecal                            valve. Biopsies were taken with a cold forceps for  histology.                           The exam was otherwise without abnormality on                            direct and retroflexion views. Complications:            No immediate complications. Estimated blood loss:                            None. Estimated Blood Loss:     Estimated blood loss: none. Impression:               - Granularity in the terminal ileum. Biopsied.                           - Erythematous mucosa in the ascending colon and at                            the ileocecal valve. Biopsied.                           - The examination was otherwise normal on direct                            and retroflexion views. Recommendation:           - Repeat colonoscopy date to be determined after                            pending pathology results are reviewed for                            surveillance based on pathology results.                            - Patient has a contact number available for                            emergencies. The signs and symptoms of potential                            delayed complications were discussed with the                            patient. Return to normal activities tomorrow.                            Written discharge instructions were provided to the                            patient.                           - Resume previous diet.                           -  Continue present medications.                           - Await pathology results.                           - Office appt with me or an APP in 2 weeks. Meryl Dare, MD 03/22/2016 10:23:58 AM This report has been signed electronically.

## 2016-03-22 NOTE — Progress Notes (Signed)
Pt's states no medical or surgical changes since previsit or office visit. 

## 2016-03-22 NOTE — Patient Instructions (Signed)
    YOU WILL NEED AN APPOINTMENT IN 2 WEEKS WITH GI CLINIC, EITHER DR. STARK OR APP.   YOU HAD AN ENDOSCOPIC PROCEDURE TODAY AT THE [facility]:   Refer to the procedure report that was given to you for any specific questions about what was found during the examination.  If the procedure report does not answer your questions, please call your gastroenterologist to clarify.  If you requested that your care partner not be given the details of your procedure findings, then the procedure report has been included in a sealed envelope for you to review at your convenience later.  YOU SHOULD EXPECT: Some feelings of bloating in the abdomen. Passage of more gas than usual.  Walking can help get rid of the air that was put into your GI tract during the procedure and reduce the bloating. If you had a lower endoscopy (such as a colonoscopy or flexible sigmoidoscopy) you may notice spotting of blood in your stool or on the toilet paper. If you underwent a bowel prep for your procedure, you may not have a normal bowel movement for a few days.  Please Note:  You might notice some irritation and congestion in your nose or some drainage.  This is from the oxygen used during your procedure.  There is no need for concern and it should clear up in a day or so.  SYMPTOMS TO REPORT IMMEDIATELY:   Following lower endoscopy (colonoscopy or flexible sigmoidoscopy):  Excessive amounts of blood in the stool  Significant tenderness or worsening of abdominal pains  Swelling of the abdomen that is new, acute  Fever of 100F or higher   For urgent or emergent issues, a gastroenterologist can be reached at any hour by calling [phone].   DIET:  We do recommend a small meal at first, but then you may proceed to your regular diet.  Drink plenty of fluids but you should avoid alcoholic beverages for 24 hours.  ACTIVITY:  You should plan to take it easy for the rest of today and you should NOT DRIVE or use  heavy machinery until tomorrow (because of the sedation medicines used during the test).    FOLLOW UP: Our staff will call the number listed on your records the next business day following your procedure to check on you and address any questions or concerns that you may have regarding the information given to you following your procedure. If we do not reach you, we will leave a message.  However, if you are feeling well and you are not experiencing any problems, there is no need to return our call.  We will assume that you have returned to your regular daily activities without incident.  If any biopsies were taken you will be contacted by phone or by letter within the next 1-3 weeks.  Please call us at [phone] if you have not heard about the biopsies in 3 weeks.    SIGNATURES/CONFIDENTIALITY: You and/or your care partner have signed paperwork which will be entered into your electronic medical record.  These signatures attest to the fact that that the information above on your After Visit Summary has been reviewed and is understood.  Full responsibility of the confidentiality of this discharge information lies with you and/or your care-partner.

## 2016-03-22 NOTE — Progress Notes (Signed)
A and O x3. Report to RN. Tolerated MAC anesthesia well.

## 2016-03-23 ENCOUNTER — Telehealth: Payer: Self-pay | Admitting: *Deleted

## 2016-03-23 NOTE — Telephone Encounter (Signed)
No answer. Left message to call if questions or concerns. 

## 2016-03-26 ENCOUNTER — Telehealth: Payer: Self-pay | Admitting: *Deleted

## 2016-03-26 NOTE — Telephone Encounter (Signed)
No ANSWER, MESSAGE LEFT FOR THE PATIENT.

## 2016-04-03 ENCOUNTER — Encounter: Payer: Self-pay | Admitting: Gastroenterology

## 2016-05-21 ENCOUNTER — Ambulatory Visit: Payer: BLUE CROSS/BLUE SHIELD

## 2016-05-22 ENCOUNTER — Ambulatory Visit (INDEPENDENT_AMBULATORY_CARE_PROVIDER_SITE_OTHER): Payer: BLUE CROSS/BLUE SHIELD | Admitting: Emergency Medicine

## 2016-05-22 VITALS — BP 142/100 | HR 79 | Temp 98.9°F | Resp 16 | Ht 59.5 in | Wt 147.8 lb

## 2016-05-22 DIAGNOSIS — R059 Cough, unspecified: Secondary | ICD-10-CM | POA: Insufficient documentation

## 2016-05-22 DIAGNOSIS — R05 Cough: Secondary | ICD-10-CM | POA: Diagnosis not present

## 2016-05-22 DIAGNOSIS — J209 Acute bronchitis, unspecified: Secondary | ICD-10-CM

## 2016-05-22 MED ORDER — AZITHROMYCIN 250 MG PO TABS: ORAL_TABLET | ORAL | 0 refills | Status: DC

## 2016-05-22 MED ORDER — HYDROCODONE-HOMATROPINE 5-1.5 MG/5ML PO SYRP: 5.0000 mL | ORAL_SOLUTION | Freq: Three times a day (TID) | ORAL | 0 refills | Status: DC | PRN

## 2016-05-22 NOTE — Patient Instructions (Addendum)
     IF you received an x-ray today, you will receive an invoice from [facility]. Please contact  Radiology at [phone] with questions or concerns regarding your invoice.   IF you received labwork today, you will receive an invoice from [facility]. Please contact [facility] at [phone] with questions or concerns regarding your invoice.   Our billing staff will not be able to assist you with questions regarding bills from these companies.  You will be contacted with the lab results as soon as they are available. The fastest way to get your results is to activate your My Chart account. Instructions are located on the last page of this paperwork. If you have not heard from us regarding the results in 2 weeks, please contact this office.      Acute Bronchitis, Adult Acute bronchitis is when air tubes (bronchi) in the lungs suddenly get swollen. The condition can make it hard to breathe. It can also cause these symptoms:  A cough.  Coughing up clear, yellow, or green mucus.  Wheezing.  Chest congestion.  Shortness of breath.  A fever.  Body aches.  Chills.  A sore throat.  Follow these instructions at home: Medicines  Take over-the-counter and prescription medicines only as told by your doctor.  If you were prescribed an antibiotic medicine, take it as told by your doctor. Do not stop taking the antibiotic even if you start to feel better. General instructions  Rest.  Drink enough fluids to keep your pee (urine) clear or pale yellow.  Avoid smoking and secondhand smoke. If you smoke and you need help quitting, ask your doctor. Quitting will help your lungs heal faster.  Use an inhaler, cool mist vaporizer, or humidifier as told by your doctor.  Keep all follow-up visits as told by your doctor. This is important. How is this prevented? To lower your risk of getting this condition again:  Wash your hands often with soap and water. If you cannot  use soap and water, use hand sanitizer.  Avoid contact with people who have cold symptoms.  Try not to touch your hands to your mouth, nose, or eyes.  Make sure to get the flu shot every year.  Contact a doctor if:  Your symptoms do not get better in 2 weeks. Get help right away if:  You cough up blood.  You have chest pain.  You have very bad shortness of breath.  You become dehydrated.  You faint (pass out) or keep feeling like you are going to pass out.  You keep throwing up (vomiting).  You have a very bad headache.  Your fever or chills gets worse. This information is not intended to replace advice given to you by your health care provider. Make sure you discuss any questions you have with your health care provider. Document Released: 07/18/2007 Document Revised: 09/07/2015 Document Reviewed: 07/20/2015 Elsevier Interactive Patient Education  2017 Elsevier Inc.  

## 2016-05-22 NOTE — Progress Notes (Signed)
Steven Sullivan 41 y.o.   Chief Complaint  Patient presents with  . Cough    X 4 DAYS WITH CHILLS  . Sore Throat    HISTORY OF PRESENT ILLNESS: This is a 41 y.o. male complaining of productive cough and sore throat with chills x 4-5 days.  Cough  This is a new problem. The current episode started in the past 7 days. The problem has been gradually worsening. The problem occurs constantly. The cough is productive of sputum. Associated symptoms include chills, myalgias, nasal congestion, rhinorrhea and a sore throat. Pertinent negatives include no chest pain, ear pain, eye redness, fever, headaches, heartburn, hemoptysis, rash, shortness of breath or weight loss. Treatments tried: Mucinex. There is no history of asthma, COPD, emphysema or pneumonia.     Prior to Admission medications   Medication Sig Start Date End Date Taking? Authorizing Provider  lisinopril-hydrochlorothiazide (PRINZIDE,ZESTORETIC) 10-12.5 MG tablet TAKE 1 TABLET BY MOUTH DAILY. 07/08/15  Yes Ofilia Neas, PA-C  omeprazole (PRILOSEC) 40 MG capsule Take 1 capsule (40 mg total) by mouth daily. Patient not taking: Reported on 05/22/2016 03/02/16   Doyle Askew, FNP    No Known Allergies  Patient Active Problem List   Diagnosis Date Noted  . Abnormal CT of the abdomen 03/09/2016  . Abdominal pain 03/09/2016  . Leukocytosis 03/09/2016  . Hepatic steatosis 11/03/2011  . Hypertension, uncontrolled 11/02/2011  . Chest pain, negative MI, secondary to uncontrolled HTN vs. dyspepsia   11/02/2011  . GERD (gastroesophageal reflux disease) 11/02/2011    Past Medical History:  Diagnosis Date  . GERD (gastroesophageal reflux disease) 11/02/2011  . Hepatic steatosis 11/03/2011  . Hypertension     No past surgical history on file.  Social History   Social History  . Marital status: Married    Spouse name: N/A  . Number of children: 0  . Years of education: 5   Occupational History  . 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 Smoking status: Never Smoker  . Smokeless tobacco: Never Used  . Alcohol use No  . Drug use: No  . Sexual activity: Not on file   Other Topics Concern  . Not on file   Social History Narrative   From [place].  Came to the [place] in 01/2005.    No family history on file.   Review of Systems  Constitutional: Positive for chills and malaise/fatigue. Negative for fever and weight loss.  HENT: Positive for congestion, rhinorrhea and sore throat. Negative for ear pain and nosebleeds.   Eyes: Negative for discharge and redness.  Respiratory: Positive for cough and sputum production. Negative for hemoptysis and shortness of breath.   Cardiovascular: Negative for chest pain, palpitations and leg swelling.  Gastrointestinal: Negative for abdominal pain, diarrhea, heartburn, nausea and vomiting.  Genitourinary: Negative for dysuria and hematuria.  Musculoskeletal: Positive for myalgias.  Skin: Negative for rash.  Neurological: Positive for weakness. Negative for dizziness, sensory change, focal weakness and headaches.  Endo/Heme/Allergies: Negative.   All other systems reviewed and are negative.  Vitals:   05/22/16 0811 05/22/16 0813  BP: (!) 164/107 (!) 142/100  Pulse: 79   Resp: 16   Temp: 98.9 F (37.2 C)      Physical Exam  Constitutional: He is oriented to person, place, and time. He appears well-developed and well-nourished.  HENT:  Head: Normocephalic and atraumatic.  Right Ear: External ear normal.  Left Ear: External ear normal.  Nose: Nose normal.  Mouth/Throat: Posterior oropharyngeal erythema present. No oropharyngeal exudate.  Eyes: Conjunctivae and EOM are normal. Pupils are equal, round, and reactive to light.  Neck: Normal range of motion. Neck supple. No JVD present. No thyromegaly present.  Cardiovascular: Normal rate, regular rhythm and normal heart sounds.   Pulmonary/Chest: Effort normal and breath sounds normal.    Abdominal: Soft. Bowel sounds are normal. He exhibits no distension. There is no tenderness.  Musculoskeletal: Normal range of motion.  Lymphadenopathy:    He has no cervical adenopathy.  Neurological: He is alert and oriented to person, place, and time. No sensory deficit. He exhibits normal muscle tone.  Skin: Skin is warm and dry. Capillary refill takes less than 2 seconds.  Psychiatric: He has a normal mood and affect. His behavior is normal.  Vitals reviewed.    ASSESSMENT & PLAN: Aurelio was seen today for cough and sore throat.  Diagnoses and all orders for this visit:  Acute bronchitis, unspecified organism  Cough  Other orders -     azithromycin (ZITHROMAX) 250 MG tablet; Sig as indicated -     HYDROcodone-homatropine (HYCODAN) 5-1.5 MG/5ML syrup; Take 5 mLs by mouth every 8 (eight) hours as needed for cough.    Patient Instructions       IF you received an x-ray today, you will receive an invoice from [facility]. Please contact [facility] at [phone] with questions or concerns regarding your invoice.   IF you received labwork today, you will receive an invoice from [facility]. Please contact [facility] at [phone] with questions or concerns regarding your invoice.   Our billing staff will not be able to assist you with questions regarding bills from these companies.  You will be contacted with the lab results as soon as they are available. The fastest way to get your results is to activate your My Chart account. Instructions are located on the last page of this paperwork. If you have not heard from [place] regarding the results in 2 weeks, please contact this office.     Acute Bronchitis, Adult Acute bronchitis is when air tubes (bronchi) in the lungs suddenly get swollen. The condition can make it hard to breathe. It can also cause these symptoms:  A cough.  Coughing up clear, yellow, or green mucus.  Wheezing.  Chest  congestion.  Shortness of breath.  A fever.  Body aches.  Chills.  A sore throat. Follow these instructions at home: Medicines   Take over-the-counter and prescription medicines only as told by your doctor.  If you were prescribed an antibiotic medicine, take it as told by your doctor. Do not stop taking the antibiotic even if you start to feel better. General instructions   Rest.  Drink enough fluids to keep your pee (urine) clear or pale yellow.  Avoid smoking and secondhand smoke. If you smoke and you need help quitting, ask your doctor. Quitting will help your lungs heal faster.  Use an inhaler, cool mist vaporizer, or humidifier as told by your doctor.  Keep all follow-up visits as told by your doctor. This is important. How is this prevented? To lower your risk of getting this condition again:  Wash your hands often with soap and water. If you cannot use soap and water, use hand sanitizer.  Avoid contact with people who have cold symptoms.  Try not to touch your hands to your mouth, nose, or eyes.  Make sure to get the flu shot every year. Contact a doctor if:  Your  symptoms do not get better in 2 weeks. Get help right away if:  You cough up blood.  You have chest pain.  You have very bad shortness of breath.  You become dehydrated.  You faint (pass out) or keep feeling like you are going to pass out.  You keep throwing up (vomiting).  You have a very bad headache.  Your fever or chills gets worse. This information is not intended to replace advice given to you by your health care provider. Make sure you discuss any questions you have with your health care provider. Document Released: 07/18/2007 Document Revised: 09/07/2015 Document Reviewed: 07/20/2015 Elsevier Interactive Patient Education  2017 Elsevier Inc.      Edwina Barth, MD [facility]

## 2016-08-31 ENCOUNTER — Other Ambulatory Visit: Payer: Self-pay | Admitting: Physician Assistant

## 2016-08-31 DIAGNOSIS — I10 Essential (primary) hypertension: Secondary | ICD-10-CM

## 2016-09-10 ENCOUNTER — Ambulatory Visit (INDEPENDENT_AMBULATORY_CARE_PROVIDER_SITE_OTHER): Payer: BLUE CROSS/BLUE SHIELD | Admitting: Urgent Care

## 2016-09-10 ENCOUNTER — Encounter: Payer: Self-pay | Admitting: Urgent Care

## 2016-09-10 VITALS — BP 134/86 | HR 70 | Temp 98.0°F | Resp 18 | Ht 59.45 in | Wt 153.0 lb

## 2016-09-10 DIAGNOSIS — K219 Gastro-esophageal reflux disease without esophagitis: Secondary | ICD-10-CM

## 2016-09-10 DIAGNOSIS — R7303 Prediabetes: Secondary | ICD-10-CM

## 2016-09-10 DIAGNOSIS — I10 Essential (primary) hypertension: Secondary | ICD-10-CM | POA: Diagnosis not present

## 2016-09-10 MED ORDER — OMEPRAZOLE 20 MG PO CPDR: 20.0000 mg | DELAYED_RELEASE_CAPSULE | Freq: Every day | ORAL | 1 refills | Status: DC

## 2016-09-10 MED ORDER — LISINOPRIL-HYDROCHLOROTHIAZIDE 10-12.5 MG PO TABS: ORAL_TABLET | ORAL | 1 refills | Status: DC

## 2016-09-10 NOTE — Patient Instructions (Addendum)
Hypertension Hypertension, commonly called high blood pressure, is when the force of blood pumping through the arteries is too strong. The arteries are the blood vessels that carry blood from the heart throughout the body. Hypertension forces the heart to work harder to pump blood and may cause arteries to become narrow or stiff. Having untreated or uncontrolled hypertension can cause heart attacks, strokes, kidney disease, and other problems. A blood pressure reading consists of a higher number over a lower number. Ideally, your blood pressure should be below 120/80. The first ("top") number is called the systolic pressure. It is a measure of the pressure in your arteries as your heart beats. The second ("bottom") number is called the diastolic pressure. It is a measure of the pressure in your arteries as the heart relaxes. What are the causes? The cause of this condition is not known. What increases the risk? Some risk factors for high blood pressure are under your control. Others are not. Factors you can change  Smoking.  Having type 2 diabetes mellitus, high cholesterol, or both.  Not getting enough exercise or physical activity.  Being overweight.  Having too much fat, sugar, calories, or salt (sodium) in your diet.  Drinking too much alcohol. Factors that are difficult or impossible to change  Having chronic kidney disease.  Having a family history of high blood pressure.  Age. Risk increases with age.  Race. You may be at higher risk if you are African-American.  Gender. Men are at higher risk than women before age 45. After age 65, women are at higher risk than men.  Having obstructive sleep apnea.  Stress. What are the signs or symptoms? Extremely high blood pressure (hypertensive crisis) may cause:  Headache.  Anxiety.  Shortness of breath.  Nosebleed.  Nausea and vomiting.  Severe chest pain.  Jerky movements you cannot control (seizures).  How is this  diagnosed? This condition is diagnosed by measuring your blood pressure while you are seated, with your arm resting on a surface. The cuff of the blood pressure monitor will be placed directly against the skin of your upper arm at the level of your heart. It should be measured at least twice using the same arm. Certain conditions can cause a difference in blood pressure between your right and left arms. Certain factors can cause blood pressure readings to be lower or higher than normal (elevated) for a short period of time:  When your blood pressure is higher when you are in a health care provider's office than when you are at home, this is called white coat hypertension. Most people with this condition do not need medicines.  When your blood pressure is higher at home than when you are in a health care provider's office, this is called masked hypertension. Most people with this condition may need medicines to control blood pressure.  If you have a high blood pressure reading during one visit or you have normal blood pressure with other risk factors:  You may be asked to return on a different day to have your blood pressure checked again.  You may be asked to monitor your blood pressure at home for 1 week or longer.  If you are diagnosed with hypertension, you may have other blood or imaging tests to help your health care provider understand your overall risk for other conditions. How is this treated? This condition is treated by making healthy lifestyle changes, such as eating healthy foods, exercising more, and reducing your alcohol intake. Your   health care provider may prescribe medicine if lifestyle changes are not enough to get your blood pressure under control, and if:  Your systolic blood pressure is above 130.  Your diastolic blood pressure is above 80.  Your personal target blood pressure may vary depending on your medical conditions, your age, and other factors. Follow these  instructions at home: Eating and drinking  Eat a diet that is high in fiber and potassium, and low in sodium, added sugar, and fat. An example eating plan is called the DASH (Dietary Approaches to Stop Hypertension) diet. To eat this way: ? Eat plenty of fresh fruits and vegetables. Try to fill half of your plate at each meal with fruits and vegetables. ? Eat whole grains, such as whole wheat pasta, brown rice, or whole grain bread. Fill about one quarter of your plate with whole grains. ? Eat or drink low-fat dairy products, such as skim milk or low-fat yogurt. ? Avoid fatty cuts of meat, processed or cured meats, and poultry with skin. Fill about one quarter of your plate with lean proteins, such as fish, chicken without skin, beans, eggs, and tofu. ? Avoid premade and processed foods. These tend to be higher in sodium, added sugar, and fat.  Reduce your daily sodium intake. Most people with hypertension should eat less than 1,500 mg of sodium a day.  Limit alcohol intake to no more than 1 drink a day for nonpregnant women and 2 drinks a day for men. One drink equals 12 oz of beer, 5 oz of wine, or 1 oz of hard liquor. Lifestyle  Work with your health care provider to maintain a healthy body weight or to lose weight. Ask what an ideal weight is for you.  Get at least 30 minutes of exercise that causes your heart to beat faster (aerobic exercise) most days of the week. Activities may include walking, swimming, or biking.  Include exercise to strengthen your muscles (resistance exercise), such as pilates or lifting weights, as part of your weekly exercise routine. Try to do these types of exercises for 30 minutes at least 3 days a week.  Do not use any products that contain nicotine or tobacco, such as cigarettes and e-cigarettes. If you need help quitting, ask your health care provider.  Monitor your blood pressure at home as told by your health care provider.  Keep all follow-up visits as  told by your health care provider. This is important. Medicines  Take over-the-counter and prescription medicines only as told by your health care provider. Follow directions carefully. Blood pressure medicines must be taken as prescribed.  Do not skip doses of blood pressure medicine. Doing this puts you at risk for problems and can make the medicine less effective.  Ask your health care provider about side effects or reactions to medicines that you should watch for. Contact a health care provider if:  You think you are having a reaction to a medicine you are taking.  You have headaches that keep coming back (recurring).  You feel dizzy.  You have swelling in your ankles.  You have trouble with your vision. Get help right away if:  You develop a severe headache or confusion.  You have unusual weakness or numbness.  You feel faint.  You have severe pain in your chest or abdomen.  You vomit repeatedly.  You have trouble breathing. Summary  Hypertension is when the force of blood pumping through your arteries is too strong. If this condition is not   controlled, it may put you at risk for serious complications.  Your personal target blood pressure may vary depending on your medical conditions, your age, and other factors. For most people, a normal blood pressure is less than 120/80.  Hypertension is treated with lifestyle changes, medicines, or a combination of both. Lifestyle changes include weight loss, eating a healthy, low-sodium diet, exercising more, and limiting alcohol. This information is not intended to replace advice given to you by your health care provider. Make sure you discuss any questions you have with your health care provider. Document Released: 01/29/2005 Document Revised: 12/28/2015 Document Reviewed: 12/28/2015 Elsevier Interactive Patient Education  2018 [employer]     Food Choices for Gastroesophageal Reflux Disease, Adult When you have  gastroesophageal reflux disease (GERD), the foods you eat and your eating habits are very important. Choosing the right foods can help ease the discomfort of GERD. Consider working with a diet and nutrition specialist (dietitian) to help you make healthy food choices. What general guidelines should I follow? Eating plan  Choose healthy foods low in fat, such as fruits, vegetables, whole grains, low-fat dairy products, and lean meat, fish, and poultry.  Eat frequent, small meals instead of three large meals each day. Eat your meals slowly, in a relaxed setting. Avoid bending over or lying down until 2-3 hours after eating.  Limit high-fat foods such as fatty meats or fried foods.  Limit your intake of oils, butter, and shortening to less than 8 teaspoons each day.  Avoid the following: ? Foods that cause symptoms. These may be different for different people. Keep a food diary to keep track of foods that cause symptoms. ? Alcohol. ? Drinking large amounts of liquid with meals. ? Eating meals during the 2-3 hours before bed.  Cook foods using methods other than frying. This may include baking, grilling, or broiling. Lifestyle   Maintain a healthy weight. Ask your health care provider what weight is healthy for you. If you need to lose weight, work with your health care provider to do so safely.  Exercise for at least 30 minutes on 5 or more days each week, or as told by your health care provider.  Avoid wearing clothes that fit tightly around your waist and chest.  Do not use any products that contain nicotine or tobacco, such as cigarettes and e-cigarettes. If you need help quitting, ask your health care provider.  Sleep with the head of your bed raised. Use a wedge under the mattress or blocks under the bed frame to raise the head of the bed. What foods are not recommended? The items listed may not be a complete list. Talk with your dietitian about what dietary choices are best for  you. Grains Pastries or quick breads with added fat. [place] toast. Vegetables Deep fried vegetables. [place] fries. Any vegetables prepared with added fat. Any vegetables that cause symptoms. For some people this may include tomatoes and tomato products, chili peppers, onions and garlic, and horseradish. Fruits Any fruits prepared with added fat. Any fruits that cause symptoms. For some people this may include citrus fruits, such as oranges, grapefruit, pineapple, and lemons. Meats and other protein foods High-fat meats, such as fatty beef or pork, hot dogs, ribs, ham, sausage, salami and bacon. Fried meat or protein, including fried fish and fried chicken. Nuts and nut butters. Dairy Whole milk and chocolate milk. Sour cream. Cream. Ice cream. Cream cheese. Milk shakes. Beverages Coffee and tea, with or without caffeine. Carbonated beverages.  Sodas. Energy drinks. Fruit juice made with acidic fruits (such as orange or grapefruit). Tomato juice. Alcoholic drinks. Fats and oils Butter. Margarine. Shortening. Ghee. Sweets and desserts Chocolate and cocoa. Donuts. Seasoning and other foods Pepper. Peppermint and spearmint. Any condiments, herbs, or seasonings that cause symptoms. For some people, this may include curry, hot sauce, or vinegar-based salad dressings. Summary  When you have gastroesophageal reflux disease (GERD), food and lifestyle choices are very important to help ease the discomfort of GERD.  Eat frequent, small meals instead of three large meals each day. Eat your meals slowly, in a relaxed setting. Avoid bending over or lying down until 2-3 hours after eating.  Limit high-fat foods such as fatty meat or fried foods. This information is not intended to replace advice given to you by your health care provider. Make sure you discuss any questions you have with your health care provider. Document Released: 01/29/2005 Document Revised: 01/31/2016 Document Reviewed:  01/31/2016 Elsevier Interactive Patient Education  2017 [employer]     IF you received an x-ray today, [facility] an [facility]. Please contact [facility] at [phone] with questions or concerns regarding your invoice.   IF you received labwork today, you will receive an [facility] from [facility]. Please contact [facility] at [phone] with questions or concerns regarding your invoice.   Our billing staff will not be able to assist you with questions regarding bills from these companies.  You will be contacted with the lab results as soon as they are available. The fastest way to get your results is to activate your My Chart account. Instructions are located on the last page of this paperwork. If you have not heard from us regarding the results in 2 weeks, please contact this office.

## 2016-09-10 NOTE — Progress Notes (Signed)
   MRN: [id number] DOB: 09-17-1975  Subjective:   Steven Sullivan is a 41 y.o. male presenting for medication refills.  HTN - Currently managed with lis-HCTZ. He ran out of his medications yesterday. Patient is not checking blood pressure at home. Eats a lot of salt in diet. He is exercising, stays active with work. Reports 1 day history of headache, frontal, feels like a hot sensation. Denies dizziness, weakness, confusion, blurred vision, chest pain, shortness of breath, heart racing, palpitations, nausea, vomiting, abdominal pain, hematuria, lower leg swelling. Denies smoking cigarettes or drinking alcohol.   GERD - Eats spicy foods and causes heartburn. He manages this well with Prilosec as needed.   Steven Sullivan has a current medication list which includes the following prescription(s): lisinopril-hydrochlorothiazide and omeprazole. Also has No Known Allergies.  Steven Sullivan  has a past medical history of GERD (gastroesophageal reflux disease) (11/02/2011); Hepatic steatosis (11/03/2011); and Hypertension. Also denies past surgical history.  Objective:   Vitals: BP 134/86   Pulse 70   Temp 98 F (36.7 C) (Oral)   Resp 18   Ht 4' 11.45" (1.51 m)   Wt 153 lb (69.4 kg)   SpO2 98%   BMI 30.44 kg/m   Physical Exam  Constitutional: He is oriented to person, place, and time. He appears well-developed and well-nourished.  HENT:  Mouth/Throat: Oropharynx is clear and moist.  Eyes: No scleral icterus.  Neck: Normal range of motion. Neck supple. No thyromegaly present.  Cardiovascular: Normal rate, regular rhythm and intact distal pulses.  Exam reveals no gallop and no friction rub.   No murmur heard. Pulmonary/Chest: No respiratory distress. He has no wheezes. He has no rales.  Abdominal: Soft. Bowel sounds are normal. He exhibits no distension and no mass. There is no tenderness. There is no guarding.  Musculoskeletal: He exhibits no edema.  Neurological: He is alert and oriented to person, place, and time.    Skin: Skin is warm and dry. Capillary refill takes less than 2 seconds.  Psychiatric: He has a normal mood and affect.   Assessment and Plan :   1. Essential hypertension - Stable, restart BP medications, avoid salt in diet. Labs pending. Recheck in 6 months. - Comprehensive metabolic panel - lisinopril-hydrochlorothiazide (PRINZIDE,ZESTORETIC) 10-12.5 MG tablet; TAKE 1 TABLET BY MOUTH DAILY.  Dispense: 90 tablet; Refill: 1  2. Gastroesophageal reflux disease, esophagitis presence not specified - Refilled Prilosec - omeprazole (PRILOSEC) 20 MG capsule; Take 1 capsule (20 mg total) by mouth daily.  Dispense: 90 capsule; Refill: 1   Wallis BambergMario Tarica Harl, PA-C Primary Care at [facility] [phone] 09/10/2016  5:26 PM

## 2016-09-11 LAB — COMPREHENSIVE METABOLIC PANEL
A/G RATIO: 1.6 (ref 1.2–2.2)
ALK PHOS: 60 IU/L (ref 39–117)
ALT: 32 IU/L (ref 0–44)
AST: 22 IU/L (ref 0–40)
Albumin: 4.5 g/dL (ref 3.5–5.5)
BILIRUBIN TOTAL: 0.2 mg/dL (ref 0.0–1.2)
BUN / CREAT RATIO: 20 (ref 9–20)
BUN: 17 mg/dL (ref 6–24)
CHLORIDE: 103 mmol/L (ref 96–106)
CO2: 22 mmol/L (ref 20–29)
Calcium: 8.8 mg/dL (ref 8.7–10.2)
Creatinine, Ser: 0.85 mg/dL (ref 0.76–1.27)
GFR calc non Af Amer: 107 mL/min/{1.73_m2} (ref >59)
GFR, EST AFRICAN AMERICAN: 123 mL/min/{1.73_m2} (ref >59)
GLOBULIN, TOTAL: 2.8 g/dL (ref 1.5–4.5)
Glucose: 168 mg/dL — ABNORMAL HIGH (ref 65–99)
Potassium: 3.8 mmol/L (ref 3.5–5.2)
SODIUM: 139 mmol/L (ref 134–144)
TOTAL PROTEIN: 7.3 g/dL (ref 6.0–8.5)

## 2016-09-11 NOTE — Addendum Note (Signed)
Addended by: JOHNSON, SHAQUETTA D on: 09/11/2016 02:07 PM   Modules accepted: Orders  

## 2016-09-11 NOTE — Addendum Note (Signed)
Addended by: Baldwin CrownJOHNSON, Alphons Burgert D on: 09/11/2016 02:07 PM   Modules accepted: Orders

## 2016-09-12 ENCOUNTER — Encounter: Payer: Self-pay | Admitting: Urgent Care

## 2016-09-12 LAB — HEMOGLOBIN A1C
Est. average glucose Bld gHb Est-mCnc: 126 mg/dL
HEMOGLOBIN A1C: 6 % — AB (ref 4.8–5.6)

## 2017-02-08 ENCOUNTER — Encounter: Payer: Self-pay | Admitting: Urgent Care

## 2017-02-08 ENCOUNTER — Ambulatory Visit: Payer: BLUE CROSS/BLUE SHIELD | Admitting: Urgent Care

## 2017-02-08 ENCOUNTER — Other Ambulatory Visit: Payer: Self-pay

## 2017-02-08 VITALS — BP 126/84 | HR 110 | Temp 98.7°F | Resp 16 | Ht 59.45 in | Wt 155.2 lb

## 2017-02-08 DIAGNOSIS — R519 Headache, unspecified: Secondary | ICD-10-CM

## 2017-02-08 DIAGNOSIS — R51 Headache: Secondary | ICD-10-CM | POA: Diagnosis not present

## 2017-02-08 DIAGNOSIS — J029 Acute pharyngitis, unspecified: Secondary | ICD-10-CM | POA: Diagnosis not present

## 2017-02-08 DIAGNOSIS — R6889 Other general symptoms and signs: Secondary | ICD-10-CM | POA: Diagnosis not present

## 2017-02-08 DIAGNOSIS — R6883 Chills (without fever): Secondary | ICD-10-CM

## 2017-02-08 LAB — POCT INFLUENZA A/B
INFLUENZA A, POC: NEGATIVE
INFLUENZA B, POC: NEGATIVE

## 2017-02-08 MED ORDER — HYDROCOD POLST-CPM POLST ER 10-8 MG/5ML PO SUER: 5.0000 mL | Freq: Every evening | ORAL | 0 refills | Status: DC | PRN

## 2017-02-08 MED ORDER — BENZONATATE 100 MG PO CAPS
100.0000 mg | ORAL_CAPSULE | Freq: Three times a day (TID) | ORAL | 0 refills | Status: DC | PRN
Start: 2017-02-08 — End: 2017-05-17

## 2017-02-08 MED ORDER — PSEUDOEPHEDRINE HCL 60 MG PO TABS
60.0000 mg | ORAL_TABLET | Freq: Three times a day (TID) | ORAL | 0 refills | Status: DC | PRN
Start: 2017-02-08 — End: 2017-05-17

## 2017-02-08 NOTE — Patient Instructions (Addendum)
Influenza, Adult Influenza, more commonly known as "the flu," is a viral infection that primarily affects the respiratory tract. The respiratory tract includes organs that help you breathe, such as the lungs, nose, and throat. The flu causes many common cold symptoms, as well as a high fever and body aches. The flu spreads easily from person to person (is contagious). Getting a flu shot (influenza vaccination) every year is the best way to prevent influenza. What are the causes? Influenza is caused by a virus. You can catch the virus by:  Breathing in droplets from an infected person's cough or sneeze.  Touching something that was recently contaminated with the virus and then touching your mouth, nose, or eyes.  What increases the risk? The following factors may make you more likely to get the flu:  Not cleaning your hands frequently with soap and water or alcohol-based hand sanitizer.  Having close contact with many people during cold and flu season.  Touching your mouth, eyes, or nose without washing or sanitizing your hands first.  Not drinking enough fluids or not eating a healthy diet.  Not getting enough sleep or exercise.  Being under a high amount of stress.  Not getting a yearly (annual) flu shot.  You may be at a higher risk of complications from the flu, such as a severe lung infection (pneumonia), if you:  Are over the age of 65.  Are pregnant.  Have a weakened disease-fighting system (immune system). You may have a weakened immune system if you: ? Have HIV or AIDS. ? Are undergoing chemotherapy. ? Aretaking medicines that reduce the activity of (suppress) the immune system.  Have a long-term (chronic) illness, such as heart disease, kidney disease, diabetes, or lung disease.  Have a liver disorder.  Are obese.  Have anemia.  What are the signs or symptoms? Symptoms of this condition typically last 4-10 days and may  include:  Fever.  Chills.  Headache, body aches, or muscle aches.  Sore throat.  Cough.  Runny or congested nose.  Chest discomfort and cough.  Poor appetite.  Weakness or tiredness (fatigue).  Dizziness.  Nausea or vomiting.  How is this diagnosed? This condition may be diagnosed based on your medical history and a physical exam. Your health care provider may do a nose or throat swab test to confirm the diagnosis. How is this treated? If influenza is detected early, you can be treated with antiviral medicine that can reduce the length of your illness and the severity of your symptoms. This medicine may be given by mouth (orally) or through an IV tube that is inserted in one of your veins. The goal of treatment is to relieve symptoms by taking care of yourself at home. This may include taking over-the-counter medicines, drinking plenty of fluids, and adding humidity to the air in your home. In some cases, influenza goes away on its own. Severe influenza or complications from influenza may be treated in a hospital. Follow these instructions at home:  Take over-the-counter and prescription medicines only as told by your health care provider.  Use a cool mist humidifier to add humidity to the air in your home. This can make breathing easier.  Rest as needed.  Drink enough fluid to keep your urine clear or pale yellow.  Cover your mouth and nose when you cough or sneeze.  Wash your hands with soap and water often, especially after you cough or sneeze. If soap and water are not available, use hand sanitizer.    Stay home from work or school as told by your health care provider. Unless you are visiting your health care provider, try to avoid leaving home until your fever has been gone for 24 hours without the use of medicine.  Keep all follow-up visits as told by your health care provider. This is important. How is this prevented?  Getting an annual flu shot is the best way  to avoid getting the flu. You may get the flu shot in late summer, fall, or winter. Ask your health care provider when you should get your flu shot.  Wash your hands often or use hand sanitizer often.  Avoid contact with people who are sick during cold and flu season.  Eat a healthy diet, drink plenty of fluids, get enough sleep, and exercise regularly. Contact a health care provider if:  You develop new symptoms.  You have: ? Chest pain. ? Diarrhea. ? A fever.  Your cough gets worse.  You produce more mucus.  You feel nauseous or you vomit. Get help right away if:  You develop shortness of breath or difficulty breathing.  Your skin or nails turn a bluish color.  You have severe pain or stiffness in your neck.  You develop a sudden headache or sudden pain in your face or ear.  You cannot stop vomiting. This information is not intended to replace advice given to you by your health care provider. Make sure you discuss any questions you have with your health care provider. Document Released: 01/27/2000 Document Revised: 07/07/2015 Document Reviewed: 11/23/2014 Elsevier Interactive Patient Education  2017 Elsevier Inc.    IF you received an x-ray today, you will receive an invoice from [facility]. Please contact [facility] at [phone] with questions or concerns regarding your invoice.   IF you received labwork today, you will receive an invoice from [facility]. Please contact [facility] at [phone] with questions or concerns regarding your invoice.   Our billing staff will not be able to assist you with questions regarding bills from these companies.  You will be contacted with the lab results as soon as they are available. The fastest way to get your results is to activate your My Chart account. Instructions are located on the last page of this paperwork. If you have not heard from us regarding the results in 2 weeks, please contact this office.       

## 2017-02-08 NOTE — Progress Notes (Signed)
  MRN: [id number] DOB: January 23, 1976  Subjective:   Steven Sullivan is a 41 y.o. male presenting for 1 day history of sore throat, dry cough, chills, subjective fever, malaise, fatigue. Has tried NyQuil with minimal relief. Hydrates well. Denies fever, sinus pain, ear pain, chest pain, shob, wheezing, n/v, abdominal pain, rashes. Denies smoking cigarettes. Denies history of allergies, asthma.   Steven Sullivan has a current medication list which includes the following prescription(s): lisinopril-hydrochlorothiazide and omeprazole. Also has No Known Allergies.  Steven Sullivan  has a past medical history of GERD (gastroesophageal reflux disease) (11/02/2011), Hepatic steatosis (11/03/2011), and Hypertension. Also  has no past surgical history on file.  Objective:   Vitals: BP 126/84 (BP Location: Left Arm, Patient Position: Sitting, Cuff Size: Normal)   Pulse (!) 110   Temp 98.7 F (37.1 C) (Oral)   Resp 16   Ht 4' 11.45" (1.51 m)   Wt 155 lb 3.2 oz (70.4 kg)   SpO2 98%   BMI 30.87 kg/m   Physical Exam  Constitutional: He is oriented to person, place, and time. He appears well-developed and well-nourished.  HENT:  TM's intact bilaterally, no effusions or erythema. Nasal turbinates pink and moist, nasal passages patent. No sinus tenderness. Oropharynx clear, mucous membranes moist.  Eyes: Right eye exhibits no discharge. Left eye exhibits no discharge.  Neck: Normal range of motion. Neck supple.  Cardiovascular: Normal rate, regular rhythm and intact distal pulses. Exam reveals no gallop and no friction rub.  No murmur heard. Pulmonary/Chest: No respiratory distress. He has no wheezes. He has no rales.  Lymphadenopathy:    He has no cervical adenopathy.  Neurological: He is alert and oriented to person, place, and time.  Skin: Skin is warm and dry.  Psychiatric: He has a normal mood and affect.   Results for orders placed or performed in visit on 02/08/17 (from the past 24 hour(s))  POCT Influenza A/B     Status:  None   Collection Time: 02/08/17  2:51 PM  Result Value Ref Range   Influenza A, POC Negative Negative   Influenza B, POC Negative Negative     Assessment and Plan :   Flu-like symptoms  Chills (without fever) - Plan: POCT Influenza A/B  Nonintractable headache, unspecified chronicity pattern, unspecified headache type  Sore throat   Will manage supportively for flu like illness. Return-to-clinic precautions discussed, patient verbalized understanding.   Wallis BambergMario Sayaka Hoeppner, PA-C [facility] Care at [facility] [phone] 02/08/2017  2:41 PM

## 2017-03-19 ENCOUNTER — Other Ambulatory Visit: Payer: Self-pay | Admitting: Urgent Care

## 2017-03-19 DIAGNOSIS — K219 Gastro-esophageal reflux disease without esophagitis: Secondary | ICD-10-CM

## 2017-03-19 DIAGNOSIS — I10 Essential (primary) hypertension: Secondary | ICD-10-CM

## 2017-03-20 NOTE — Telephone Encounter (Signed)
Contacted pt regarding refill request; last office visit 08/2016; pt states he will call back to schedule appointment.

## 2017-05-17 ENCOUNTER — Encounter: Payer: Self-pay | Admitting: Urgent Care

## 2017-05-17 ENCOUNTER — Ambulatory Visit: Payer: BLUE CROSS/BLUE SHIELD | Admitting: Urgent Care

## 2017-05-17 VITALS — BP 128/84 | HR 82 | Temp 97.9°F | Resp 18 | Ht 59.5 in | Wt 152.2 lb

## 2017-05-17 DIAGNOSIS — I10 Essential (primary) hypertension: Secondary | ICD-10-CM

## 2017-05-17 DIAGNOSIS — R0600 Dyspnea, unspecified: Secondary | ICD-10-CM | POA: Diagnosis not present

## 2017-05-17 DIAGNOSIS — R0683 Snoring: Secondary | ICD-10-CM

## 2017-05-17 MED ORDER — LISINOPRIL-HYDROCHLOROTHIAZIDE 10-12.5 MG PO TABS: 1.0000 | ORAL_TABLET | Freq: Every day | ORAL | 3 refills | Status: DC

## 2017-05-17 NOTE — Patient Instructions (Addendum)
Hypertension Hypertension, commonly called high blood pressure, is when the force of blood pumping through the arteries is too strong. The arteries are the blood vessels that carry blood from the heart throughout the body. Hypertension forces the heart to work harder to pump blood and may cause arteries to become narrow or stiff. Having untreated or uncontrolled hypertension can cause heart attacks, strokes, kidney disease, and other problems. A blood pressure reading consists of a higher number over a lower number. Ideally, your blood pressure should be below 120/80. The first ("top") number is called the systolic pressure. It is a measure of the pressure in your arteries as your heart beats. The second ("bottom") number is called the diastolic pressure. It is a measure of the pressure in your arteries as the heart relaxes. What are the causes? The cause of this condition is not known. What increases the risk? Some risk factors for high blood pressure are under your control. Others are not. Factors you can change  Smoking.  Having type 2 diabetes mellitus, high cholesterol, or both.  Not getting enough exercise or physical activity.  Being overweight.  Having too much fat, sugar, calories, or salt (sodium) in your diet.  Drinking too much alcohol. Factors that are difficult or impossible to change  Having chronic kidney disease.  Having a family history of high blood pressure.  Age. Risk increases with age.  Race. You may be at higher risk if you are African-American.  Gender. Men are at higher risk than women before age 45. After age 65, women are at higher risk than men.  Having obstructive sleep apnea.  Stress. What are the signs or symptoms? Extremely high blood pressure (hypertensive crisis) may cause:  Headache.  Anxiety.  Shortness of breath.  Nosebleed.  Nausea and vomiting.  Severe chest pain.  Jerky movements you cannot control (seizures).  How is this  diagnosed? This condition is diagnosed by measuring your blood pressure while you are seated, with your arm resting on a surface. The cuff of the blood pressure monitor will be placed directly against the skin of your upper arm at the level of your heart. It should be measured at least twice using the same arm. Certain conditions can cause a difference in blood pressure between your right and left arms. Certain factors can cause blood pressure readings to be lower or higher than normal (elevated) for a short period of time:  When your blood pressure is higher when you are in a health care provider's office than when you are at home, this is called white coat hypertension. Most people with this condition do not need medicines.  When your blood pressure is higher at home than when you are in a health care provider's office, this is called masked hypertension. Most people with this condition may need medicines to control blood pressure.  If you have a high blood pressure reading during one visit or you have normal blood pressure with other risk factors:  You may be asked to return on a different day to have your blood pressure checked again.  You may be asked to monitor your blood pressure at home for 1 week or longer.  If you are diagnosed with hypertension, you may have other blood or imaging tests to help your health care provider understand your overall risk for other conditions. How is this treated? This condition is treated by making healthy lifestyle changes, such as eating healthy foods, exercising more, and reducing your alcohol intake. Your   health care provider may prescribe medicine if lifestyle changes are not enough to get your blood pressure under control, and if:  Your systolic blood pressure is above 130.  Your diastolic blood pressure is above 80.  Your personal target blood pressure may vary depending on your medical conditions, your age, and other factors. Follow these  instructions at home: Eating and drinking  Eat a diet that is high in fiber and potassium, and low in sodium, added sugar, and fat. An example eating plan is called the DASH (Dietary Approaches to Stop Hypertension) diet. To eat this way: ? Eat plenty of fresh fruits and vegetables. Try to fill half of your plate at each meal with fruits and vegetables. ? Eat whole grains, such as whole wheat pasta, brown rice, or whole grain bread. Fill about one quarter of your plate with whole grains. ? Eat or drink low-fat dairy products, such as skim milk or low-fat yogurt. ? Avoid fatty cuts of meat, processed or cured meats, and poultry with skin. Fill about one quarter of your plate with lean proteins, such as fish, chicken without skin, beans, eggs, and tofu. ? Avoid premade and processed foods. These tend to be higher in sodium, added sugar, and fat.  Reduce your daily sodium intake. Most people with hypertension should eat less than 1,500 mg of sodium a day.  Limit alcohol intake to no more than 1 drink a day for nonpregnant women and 2 drinks a day for men. One drink equals 12 oz of beer, 5 oz of wine, or 1 oz of hard liquor. Lifestyle  Work with your health care provider to maintain a healthy body weight or to lose weight. Ask what an ideal weight is for you.  Get at least 30 minutes of exercise that causes your heart to beat faster (aerobic exercise) most days of the week. Activities may include walking, swimming, or biking.  Include exercise to strengthen your muscles (resistance exercise), such as pilates or lifting weights, as part of your weekly exercise routine. Try to do these types of exercises for 30 minutes at least 3 days a week.  Do not use any products that contain nicotine or tobacco, such as cigarettes and e-cigarettes. If you need help quitting, ask your health care provider.  Monitor your blood pressure at home as told by your health care provider.  Keep all follow-up visits as  told by your health care provider. This is important. Medicines  Take over-the-counter and prescription medicines only as told by your health care provider. Follow directions carefully. Blood pressure medicines must be taken as prescribed.  Do not skip doses of blood pressure medicine. Doing this puts you at risk for problems and can make the medicine less effective.  Ask your health care provider about side effects or reactions to medicines that you should watch for. Contact a health care provider if:  You think you are having a reaction to a medicine you are taking.  You have headaches that keep coming back (recurring).  You feel dizzy.  You have swelling in your ankles.  You have trouble with your vision. Get help right away if:  You develop a severe headache or confusion.  You have unusual weakness or numbness.  You feel faint.  You have severe pain in your chest or abdomen.  You vomit repeatedly.  You have trouble breathing. Summary  Hypertension is when the force of blood pumping through your arteries is too strong. If this condition is not   controlled, it may put you at risk for serious complications.  Your personal target blood pressure may vary depending on your medical conditions, your age, and other factors. For most people, a normal blood pressure is less than 120/80.  Hypertension is treated with lifestyle changes, medicines, or a combination of both. Lifestyle changes include weight loss, eating a healthy, low-sodium diet, exercising more, and limiting alcohol. This information is not intended to replace advice given to you by your health care provider. Make sure you discuss any questions you have with your health care provider. Document Released: 01/29/2005 Document Revised: 12/28/2015 Document Reviewed: 12/28/2015 Elsevier Interactive Patient Education  2018 Elsevier Inc.     IF you received an x-ray today, you will receive an invoice from [facility]. Please contact [facility] at [phone] with questions or concerns regarding your invoice.   IF you received labwork today, you will receive an invoice from [facility]. Please contact [facility] at [phone] with questions or concerns regarding your invoice.   Our billing staff will not be able to assist you with questions regarding bills from these companies.  You will be contacted with the lab results as soon as they are available. The fastest way to get your results is to activate your My Chart account. Instructions are located on the last page of this paperwork. If you have not heard from us regarding the results in 2 weeks, please contact this office.     

## 2017-05-17 NOTE — Progress Notes (Signed)
    MRN: [id number] DOB: Jul 09, 1975  Subjective:   Steven Sullivan is a 42 y.o. male presenting for follow up on Hypertension. Currently managed with lis-HCTZ. Patient had his driver's exam and was given an 3 month certificate due to his blood pressure. Does not actively avoid salt in diet. Reports that he snores at night, wakes up gasping for air sometimes. Denies dizziness, chronic headache, blurred vision, chest pain, shortness of breath, heart racing, palpitations, nausea, vomiting, abdominal pain, hematuria, lower leg swelling. Denies smoking cigarettes or drinking alcohol.   Truddie HiddenKaih has a current medication list which includes the following prescription(s): lisinopril-hydrochlorothiazide and omeprazole. Also has No Known Allergies.  Truddie HiddenKaih  has a past medical history of GERD (gastroesophageal reflux disease) (11/02/2011), Hepatic steatosis (11/03/2011), and Hypertension. Also  has no past surgical history on file.  Objective:   Vitals: BP 128/84   Pulse 82   Temp 97.9 F (36.6 C) (Oral)   Resp 18   Ht 4' 11.5" (1.511 m)   Wt 152 lb 3.2 oz (69 kg)   SpO2 98%   BMI 30.23 kg/m   The 10-year ASCVD risk score Denman George(Goff DC Jr., et al., 2013) is: 3.3%   Values used to calculate the score:     Age: [age] years     Sex: Male     Is Non-Hispanic African American: No     Diabetic: No     Tobacco smoker: No     Systolic Blood Pressure: 128 mmHg     Is BP treated: Yes     HDL Cholesterol: 44 mg/dL     Total Cholesterol: 248 mg/dL  Physical Exam  Constitutional: He is oriented to person, place, and time. He appears well-developed and well-nourished.  HENT:  Mouth/Throat: Oropharynx is clear and moist.  Eyes: No scleral icterus.  Cardiovascular: Normal rate, regular rhythm and intact distal pulses. Exam reveals no gallop and no friction rub.  No murmur heard. Pulmonary/Chest: No respiratory distress. He has no wheezes. He has no rales.  Neurological: He is alert and oriented to person, place, and time.    Skin: Skin is warm and dry.  Psychiatric: He has a normal mood and affect.   Assessment and Plan :   Essential hypertension - Plan: Comprehensive metabolic panel, lisinopril-hydrochlorothiazide (PRINZIDE,ZESTORETIC) 10-12.5 MG tablet, Ambulatory referral to Sleep Studies  Snoring - Plan: Ambulatory referral to Sleep Studies  Paroxysmal nocturnal dyspnea - Plan: Ambulatory referral to Sleep Studies  Will send for a sleep study to r/o sleep apnea. Labs pending. BP is controlled in our clinic today. Refilled his lis-HCTZ. Recheck in 6 months.  Wallis BambergMario Beonka Amesquita, PA-C [facility] at [facility] [phone] 05/17/2017  2:43 PM

## 2017-05-18 LAB — COMPREHENSIVE METABOLIC PANEL
ALK PHOS: 73 IU/L (ref 39–117)
ALT: 29 IU/L (ref 0–44)
AST: 21 IU/L (ref 0–40)
Albumin/Globulin Ratio: 1.5 (ref 1.2–2.2)
Albumin: 4.5 g/dL (ref 3.5–5.5)
BILIRUBIN TOTAL: 0.2 mg/dL (ref 0.0–1.2)
BUN/Creatinine Ratio: 13 (ref 9–20)
BUN: 10 mg/dL (ref 6–24)
CHLORIDE: 102 mmol/L (ref 96–106)
CO2: 22 mmol/L (ref 20–29)
Calcium: 8.9 mg/dL (ref 8.7–10.2)
Creatinine, Ser: 0.77 mg/dL (ref 0.76–1.27)
GFR calc Af Amer: 128 mL/min/{1.73_m2} (ref >59)
GFR calc non Af Amer: 111 mL/min/{1.73_m2} (ref >59)
GLUCOSE: 81 mg/dL (ref 65–99)
Globulin, Total: 3 g/dL (ref 1.5–4.5)
Potassium: 4 mmol/L (ref 3.5–5.2)
Sodium: 141 mmol/L (ref 134–144)
TOTAL PROTEIN: 7.5 g/dL (ref 6.0–8.5)

## 2017-05-23 ENCOUNTER — Encounter: Payer: Self-pay | Admitting: *Deleted

## 2017-07-16 ENCOUNTER — Institutional Professional Consult (permissible substitution): Payer: BLUE CROSS/BLUE SHIELD | Admitting: Neurology

## 2018-06-02 ENCOUNTER — Other Ambulatory Visit: Payer: Self-pay

## 2018-06-02 ENCOUNTER — Ambulatory Visit (HOSPITAL_COMMUNITY)
Admission: EM | Admit: 2018-06-02 | Discharge: 2018-06-02 | Disposition: A | Discharge status: Home / Self Care | Payer: BLUE CROSS/BLUE SHIELD | Attending: Family Medicine | Admitting: Family Medicine

## 2018-06-02 ENCOUNTER — Encounter (HOSPITAL_COMMUNITY): Payer: Self-pay

## 2018-06-02 DIAGNOSIS — R0602 Shortness of breath: Secondary | ICD-10-CM | POA: Diagnosis not present

## 2018-06-02 DIAGNOSIS — I10 Essential (primary) hypertension: Secondary | ICD-10-CM

## 2018-06-02 NOTE — ED Provider Notes (Signed)
[facility]    CSN: [id number] Arrival date & time: 06/02/18  1324     History   Chief Complaint Chief Complaint  Patient presents with  . Shortness of Breath    HPI Steven Sullivan is a 43 y.o. male.   He is presenting with shortness of breath for the past day.  It started worse this morning.  He denies any history of blood clot or travel.  Denies any exposure with anyone with similar symptoms.  Does not smoke.  Feels like he is unable to take a deep breath and express it completely.  Denies any wheezing.  Denies any PND.  Has not had any improvement with home modalities.  Denies any leg swelling  No coughing and has not started any new or different medications.  HPI  Past Medical History:  Diagnosis Date  . GERD (gastroesophageal reflux disease) 11/02/2011  . Hepatic steatosis 11/03/2011  . Hypertension     Patient Active Problem List   Diagnosis Date Noted  . Cough 05/22/2016  . Acute bronchitis 05/22/2016  . Abnormal CT of the abdomen 03/09/2016  . Abdominal pain 03/09/2016  . Leukocytosis 03/09/2016  . Hepatic steatosis 11/03/2011  . Hypertension, uncontrolled 11/02/2011  . Chest pain, negative MI, secondary to uncontrolled HTN vs. dyspepsia   11/02/2011  . GERD (gastroesophageal reflux disease) 11/02/2011    History reviewed. No pertinent surgical history.     Home Medications    Prior to Admission medications   Medication Sig Start Date End Date Taking? Authorizing Provider  lisinopril-hydrochlorothiazide (PRINZIDE,ZESTORETIC) 10-12.5 MG tablet Take 1 tablet by mouth daily. 05/17/17   Wallis BambergMani, Mario, PA-C  omeprazole (PRILOSEC) 20 MG capsule TAKE 1 CAPSULE(20 MG) BY MOUTH DAILY 03/20/17   Wallis BambergMani, Mario, PA-C    Family History History reviewed. No pertinent family history.  Social History Social History   Tobacco Use  . Smoking status: Never Smoker  . Smokeless tobacco: Never Used  Substance Use Topics  . Alcohol use: No  . Drug use: No      Allergies   Patient has no known allergies.   Review of Systems Review of Systems  Constitutional: Negative for fever.  HENT: Negative for congestion.   Respiratory: Positive for shortness of breath.   Cardiovascular: Negative for chest pain.  Gastrointestinal: Negative for abdominal pain.  Musculoskeletal: Negative for back pain.  Skin: Negative for color change.  Neurological: Positive for headaches.  Hematological: Negative for adenopathy.     Physical Exam Triage Vital Signs ED Triage Vitals  Enc Vitals Group     BP 06/02/18 1339 (!) 160/103     Pulse Rate 06/02/18 1339 (!) 116     Resp 06/02/18 1339 17     Temp 06/02/18 1339 98.4 F (36.9 C)     Temp Source 06/02/18 1339 Oral     SpO2 06/02/18 1339 98 %     Weight --      Height --      Head Circumference --      Peak Flow --      Pain Score 06/02/18 1338 0     Pain Loc --      Pain Edu? --      Excl. in GC? --    No data found.  Updated Vital Signs BP (!) 160/103 (BP Location: Left Arm)   Pulse (!) 116   Temp 98.4 F (36.9 C) (Oral)   Resp 17   SpO2 98%   Visual Acuity Right  Eye Distance:   Left Eye Distance:   Bilateral Distance:    Right Eye Near:   Left Eye Near:    Bilateral Near:     Physical Exam Gen: NAD, alert, cooperative with exam,  ENT: normal lips, normal nasal mucosa,  Eye: normal EOM, normal conjunctiva and lids CV:  no edema, +2 pedal pulses   Resp: no accessory muscle use, non-labored, speaking in full sentences. No crackles or wheezing.  GI: no masses or tenderness, no hernia  Skin: no rashes, no areas of induration  Neuro: normal tone, normal sensation to touch Psych:  normal insight, alert and oriented MSK: normal gait, normal strength    UC Treatments / Results  Labs (all labs ordered are listed, but only abnormal results are displayed) Labs Reviewed - No data to display  EKG None  Radiology No results found.  Procedures Procedures (including critical care  time)  Medications Ordered in UC Medications - No data to display  Initial Impression / Assessment and Plan / UC Course  I have reviewed the triage vital signs and the nursing notes.  Pertinent labs & imaging results that were available during my care of the patient were reviewed by me and considered in my medical decision making (see chart for details).     Steven Sullivan is a 43 yo M that Is presenting with shortness of breath.  For 1 day duration.  Does not demonstrate any crackles or wheezes on exam.  Possible that his symptoms are associated with coronavirus.  Is tachycardic but has a normal saturation and no accessory muscle use on exam today.  No leg swelling today.  Counseled on supportive care.  Provided a work note that we will keep him out of work until May 2.  Provided with indications to follow-up and seek immediate care. Advised that he needs to check his blood pressure and take his anti-hypertensive. Needs to have an evisit if blood pressure is not controlled.   Final Clinical Impressions(s) / UC Diagnoses   Final diagnoses:  SOB (shortness of breath)  Essential hypertension     Discharge Instructions     Please stay home for the next 13 days.  If your symptoms get worse then you should be seen in the emergency department  Please take your blood pressure medication. Please monitor your blood pressure at home.     ED Prescriptions    None     Controlled Substance Prescriptions Agency Controlled Substance Registry consulted? Not Applicable   Myra Rude, MD 06/02/18 [phone]

## 2018-06-02 NOTE — ED Triage Notes (Signed)
Patient presents to Urgent Care with complaints of shortness of breath this morning and headache, als since this mornign. Patient states he took his daily meds for htn, but has not tried anything else. Pt in NAD, breathing regular and non-labored.

## 2018-06-02 NOTE — Discharge Instructions (Addendum)
Please stay home for the next 13 days.  If your symptoms get worse then you should be seen in the emergency department  Please take your blood pressure medication. Please monitor your blood pressure at home.

## 2018-12-15 IMAGING — CT CT ABD-PELV W/ CM
2 of 5 series · 15 of 46 positions shown, 17 images · IV contrast (APPLIED)
Comparison: No priors.

CLINICAL DATA: 40-year-old male with history of mid abdominal pain
and right lower quadrant abdominal pain for the past 3 days with
elevated white blood cell count.

EXAM:
CT ABDOMEN AND PELVIS WITH CONTRAST
TECHNIQUE: Multidetector CT imaging of the abdomen and pelvis was performed
using the standard protocol following bolus administration of
intravenous contrast.
CONTRAST:  30mL [id number] IOPAMIDOL (XV8F0Q-ARR) INJECTION 61%, 1
XV8F0Q-ARR IOPAMIDOL (XV8F0Q-ARR) INJECTION 61%

[Series 2: axial st · axial · 0.74mm/px · z∈[+949,+1324]mm · 12 of 85 slices shown, 14 images]
[im 5/85  soft-tissue]
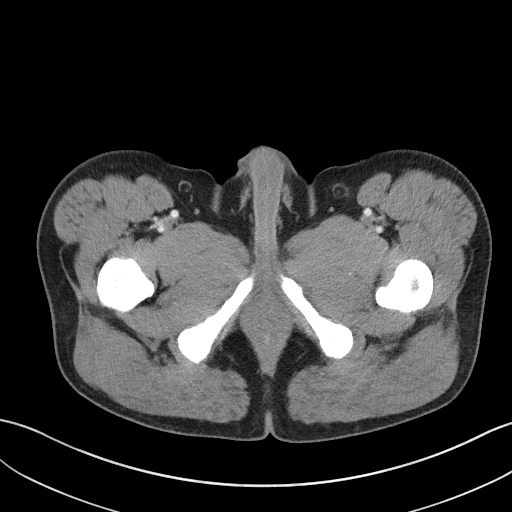
[im 5/85  bone]
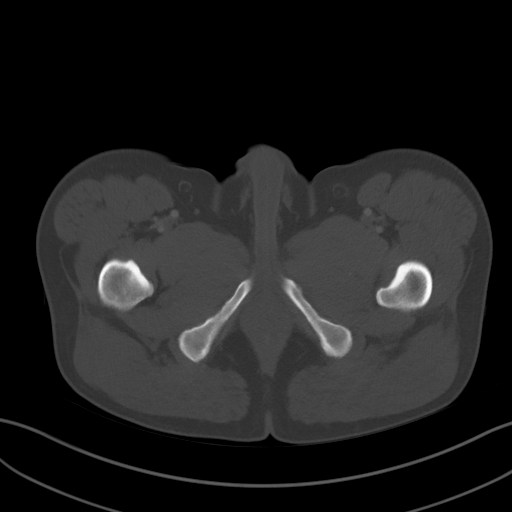
[im 15/85  soft-tissue]
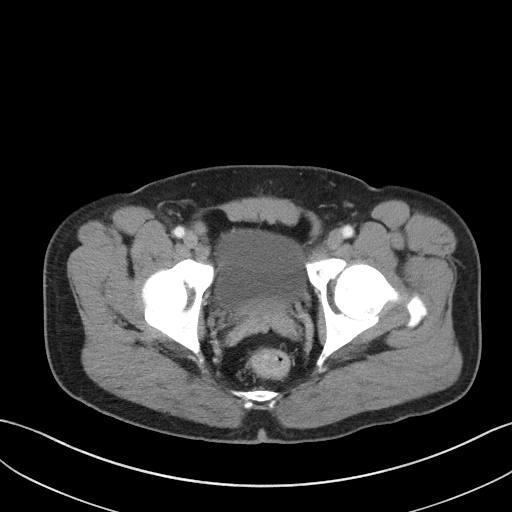
[im 19/85  soft-tissue]
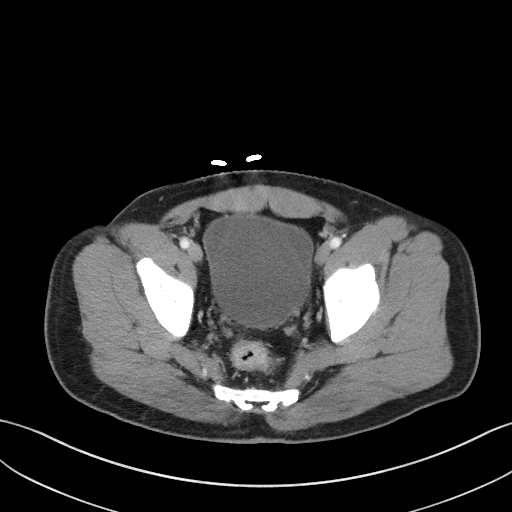
[im 24/85  soft-tissue]
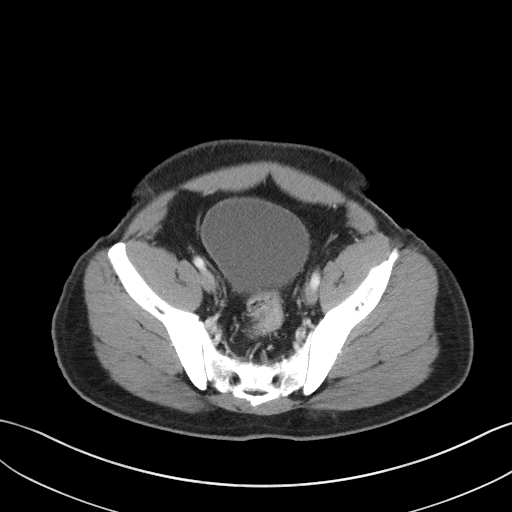
[im 33/85  soft-tissue]
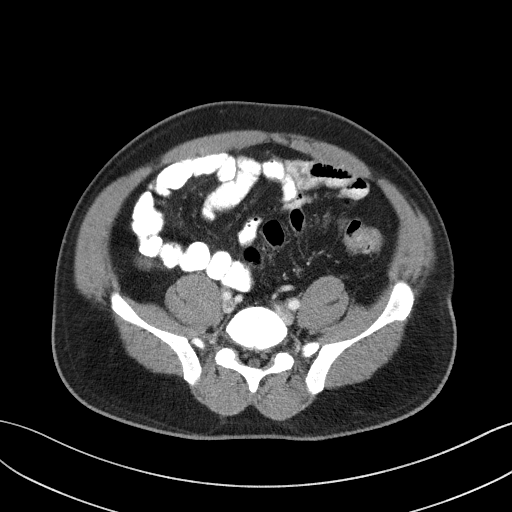
[im 38/85  soft-tissue]
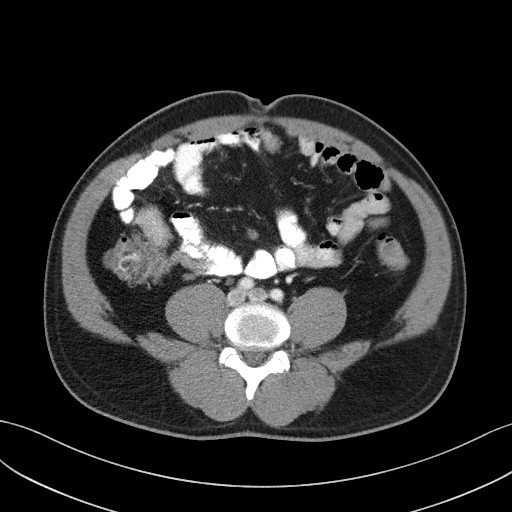
[im 47/85  soft-tissue]
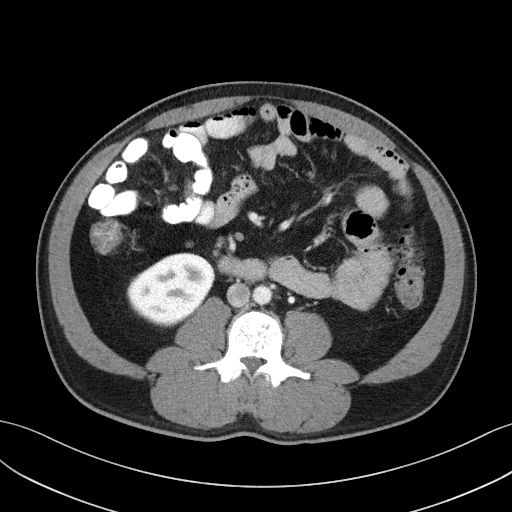
[im 52/85  soft-tissue]
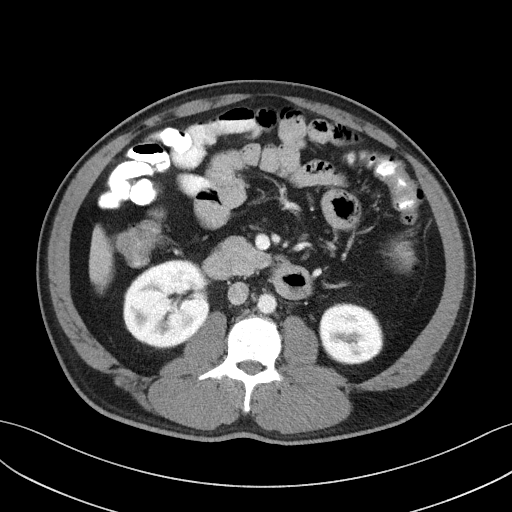
[im 61/85  soft-tissue]
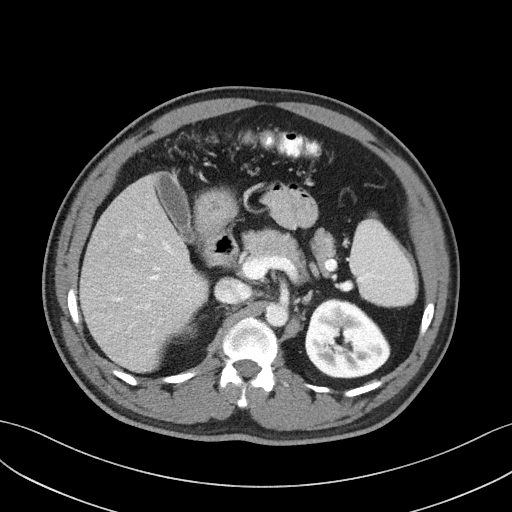
[im 61/85  bone]
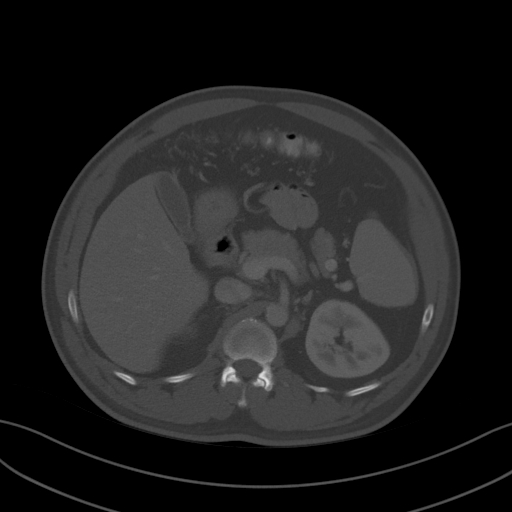
[im 66/85  soft-tissue]
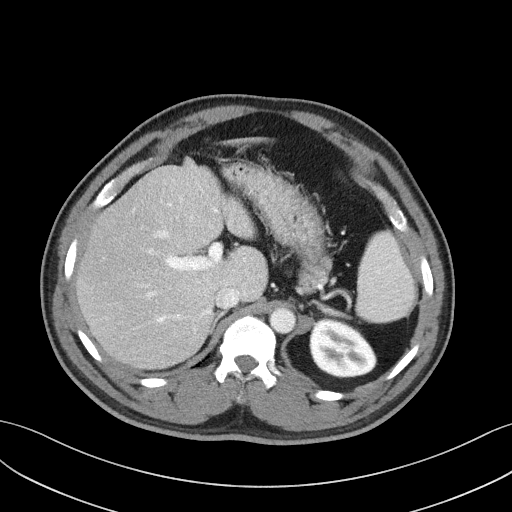
[im 71/85  soft-tissue]
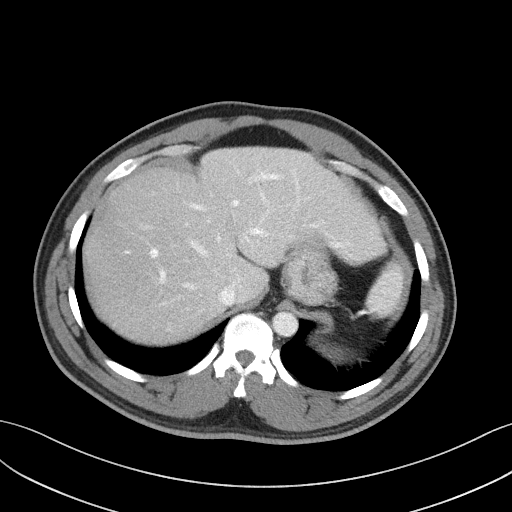
[im 80/85  soft-tissue]
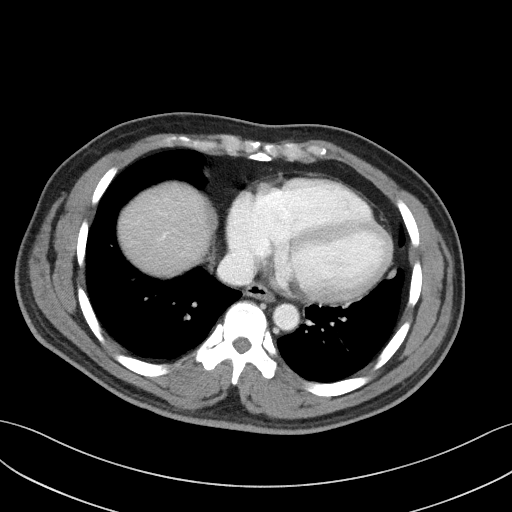

[Series 4: coronal st · coronal · 0.78mm/px · 3 of 84 slices shown]
[im 28/84  soft-tissue]
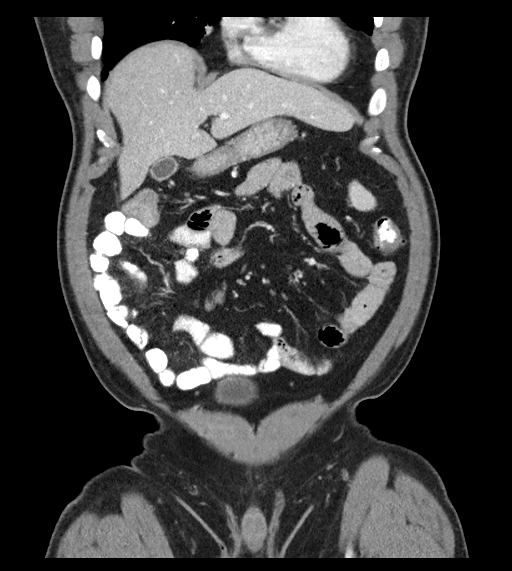
[im 37/84  soft-tissue]
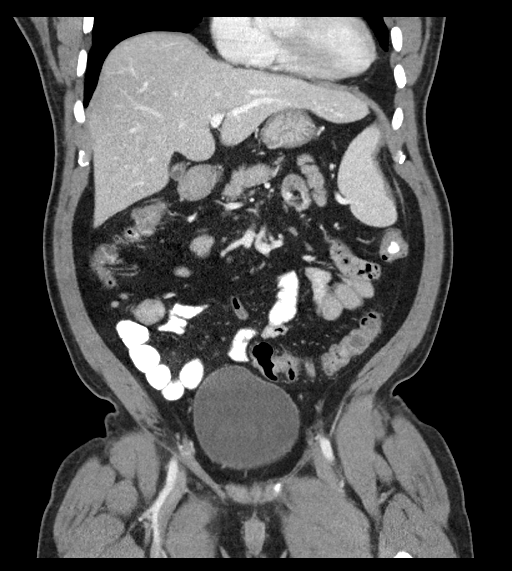
[im 47/84  soft-tissue]
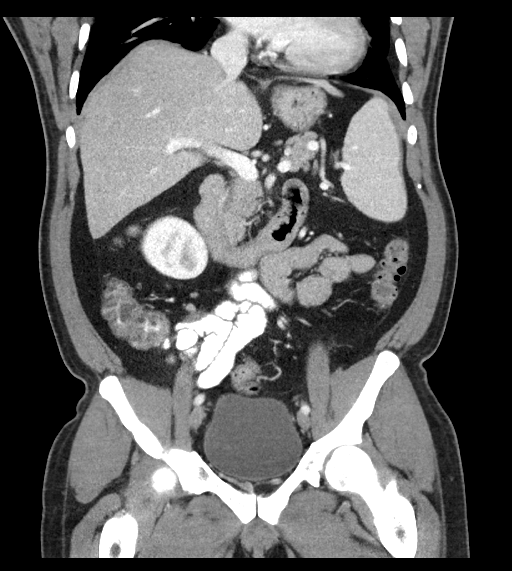

[15 of 46 positions shown; findings below may reference images not displayed]

FINDINGS: Lower chest: Unremarkable.

Hepatobiliary: No cystic or solid hepatic lesions. No intra or
extrahepatic biliary ductal dilatation. Gallbladder is normal in
appearance.

Pancreas: No pancreatic mass. No pancreatic ductal dilatation. No
pancreatic or peripancreatic fluid or inflammatory changes.

Spleen: Small amount of calcification along the lateral aspect of
the spleen with a very tiny subcapsular fluid collection associated
with the spleen which appears to be chronic based on comparison with
remote prior chest CT 11/02/2011, likely sequela of remote trauma.

Adrenals/Urinary Tract: Focal area of scarring in the upper pole of
the left kidney. Right kidney and bilateral adrenal glands are
normal in appearance. No hydroureteronephrosis. Urinary bladder is
normal in appearance.

Stomach/Bowel: The appearance of the stomach is normal. There is no
pathologic dilatation of small bowel or colon. Mural thickening in
the terminal ileum. Cecum, ascending colon and proximal transverse
colon are completely decompressed. The wall of the colon throughout
this region may be mildly thickened and there may be some very mild
hypervascularity in the associated mesocolon, although this is
difficult to judge given the decompression of the colon throughout
these regions. No additional areas of mural thickening or
surrounding inflammatory changes are confidently identified in the
small bowel or colon. Normal appendix.

Vascular/Lymphatic: No significant atherosclerotic disease, aneurysm
or dissection identified in the abdominal or pelvic vasculature.
Multiple borderline enlarged ileocolic lymph nodes are noted,
measuring up to 9 mm in short axis. No lymphadenopathy noted in the
abdomen or pelvis.

Reproductive: Prostate gland and seminal vesicles are normal in
appearance.

Other: No significant volume of ascites.  No pneumoperitoneum.

Musculoskeletal: There are no aggressive appearing lytic or blastic
lesions noted in the visualized portions of the skeleton.
IMPRESSION: 1. Mural thickening throughout the terminal ileum with multiple
borderline enlarged ileocolic lymph nodes. There is also some
subjective mural thickening involving the cecum, ascending colon and
proximal transverse colon, which may also demonstrates some mild
hypervascularity in the associated mesocolon. These findings are
nonspecific, but could be seen in the setting of inflammatory bowel
disease. Clinical correlation is suggested.

## 2018-12-31 ENCOUNTER — Other Ambulatory Visit: Payer: Self-pay

## 2018-12-31 DIAGNOSIS — Z20822 Contact with and (suspected) exposure to covid-19: Secondary | ICD-10-CM

## 2019-01-02 LAB — NOVEL CORONAVIRUS, NAA: SARS-CoV-2, NAA: DETECTED — AB

## 2019-02-24 NOTE — Addendum Note (Signed)
Encounter addended by: Eliezer Khawaja E, MD on: 02/24/2019 2:36 PM  Actions taken: Letter saved

## 2019-07-27 ENCOUNTER — Other Ambulatory Visit: Payer: Self-pay

## 2019-07-27 ENCOUNTER — Encounter (HOSPITAL_COMMUNITY): Payer: Self-pay

## 2019-07-27 ENCOUNTER — Ambulatory Visit (HOSPITAL_COMMUNITY)
Admission: EM | Admit: 2019-07-27 | Discharge: 2019-07-27 | Disposition: A | Discharge status: Home / Self Care | Payer: BC Managed Care – PPO | Attending: Internal Medicine | Admitting: Internal Medicine

## 2019-07-27 DIAGNOSIS — Z760 Encounter for issue of repeat prescription: Secondary | ICD-10-CM | POA: Diagnosis not present

## 2019-07-27 DIAGNOSIS — I10 Essential (primary) hypertension: Secondary | ICD-10-CM | POA: Diagnosis not present

## 2019-07-27 MED ORDER — LISINOPRIL-HYDROCHLOROTHIAZIDE 10-12.5 MG PO TABS: 1.0000 | ORAL_TABLET | Freq: Every day | ORAL | 3 refills | Status: DC

## 2019-07-27 MED ORDER — HYDROCODONE-ACETAMINOPHEN 5-325 MG PO TABS
2.0000 | ORAL_TABLET | ORAL | 0 refills | Status: DC | PRN
Start: 2019-07-27 — End: 2019-07-27

## 2019-07-27 NOTE — ED Notes (Signed)
BP reported to Dr. Lamptey.  

## 2019-07-27 NOTE — ED Provider Notes (Signed)
[facility]    CSN: [id number] Arrival date & time: 07/27/19  1141      History   Chief Complaint Chief Complaint  Patient presents with  . Medication Refill    HPI Steven Sullivan is a 44 y.o. male with a history of hypertension comes to urgent care requesting medication refill.  Patient has been out of his medications for over a month.  He decided to come to the urgent care today because yesterday he had a mild headache which did not resolve spontaneously.  He denies any chest or abdominal pain.  No numbness or tingling.  Patient has been eating a lot of salt and meat recently.  No weight changes.  No dizziness, near syncope or syncopal episodes.   HPI  Past Medical History:  Diagnosis Date  . GERD (gastroesophageal reflux disease) 11/02/2011  . Hepatic steatosis 11/03/2011  . Hypertension     Patient Active Problem List   Diagnosis Date Noted  . Cough 05/22/2016  . Acute bronchitis 05/22/2016  . Abnormal CT of the abdomen 03/09/2016  . Abdominal pain 03/09/2016  . Leukocytosis 03/09/2016  . Hepatic steatosis 11/03/2011  . Hypertension, uncontrolled 11/02/2011  . Chest pain, negative MI, secondary to uncontrolled HTN vs. dyspepsia   11/02/2011  . GERD (gastroesophageal reflux disease) 11/02/2011    History reviewed. No pertinent surgical history.     Home Medications    Prior to Admission medications   Medication Sig Start Date End Date Taking? Authorizing Provider  lisinopril-hydrochlorothiazide (ZESTORETIC) 10-12.5 MG tablet Take 1 tablet by mouth daily. 07/27/19   Merrilee Jansky, MD  omeprazole (PRILOSEC) 20 MG capsule TAKE 1 CAPSULE(20 MG) BY MOUTH DAILY 03/20/17   Wallis Bamberg, PA-C    Family History History reviewed. No pertinent family history.  Social History Social History   Tobacco Use  . Smoking status: Never Smoker  . Smokeless tobacco: Never Used  Vaping Use  . Vaping Use: Never used  Substance Use Topics  . Alcohol use: No  . Drug  use: No     Allergies   Patient has no known allergies.   Review of Systems Review of Systems  Constitutional: Negative.   Respiratory: Negative for chest tightness and shortness of breath.   Cardiovascular: Negative for chest pain.  Gastrointestinal: Negative for abdominal pain.  Musculoskeletal: Negative.   Neurological: Positive for headaches. Negative for dizziness and light-headedness.  Psychiatric/Behavioral: Negative for confusion and decreased concentration.     Physical Exam Triage Vital Signs ED Triage Vitals  Enc Vitals Group     BP 07/27/19 1218 (!) 157/115     Pulse Rate 07/27/19 1218 98     Resp 07/27/19 1218 18     Temp 07/27/19 1218 97.9 F (36.6 C)     Temp Source 07/27/19 1218 Oral     SpO2 07/27/19 1218 100 %     Weight --      Height --      Head Circumference --      Peak Flow --      Pain Score 07/27/19 1248 0     Pain Loc --      Pain Edu? --      Excl. in GC? --    No data found.  Updated Vital Signs BP (!) 157/115 (BP Location: Right Arm)   Pulse 98   Temp 97.9 F (36.6 C) (Oral)   Resp 18   SpO2 100%   Visual Acuity Right Eye Distance:  Left Eye Distance:   Bilateral Distance:    Right Eye Near:   Left Eye Near:    Bilateral Near:     Physical Exam Vitals and nursing note reviewed.  Constitutional:      General: He is not in acute distress.    Appearance: Normal appearance. He is not ill-appearing.  Cardiovascular:     Rate and Rhythm: Normal rate and regular rhythm.     Pulses: Normal pulses.     Heart sounds: Normal heart sounds.  Pulmonary:     Effort: Pulmonary effort is normal.     Breath sounds: Normal breath sounds.  Abdominal:     General: Bowel sounds are normal.     Palpations: Abdomen is soft. There is no mass.     Tenderness: There is no abdominal tenderness. There is no rebound.  Neurological:     Mental Status: He is alert.      UC Treatments / Results  Labs (all labs ordered are listed, but  only abnormal results are displayed) Labs Reviewed - No data to display  EKG   Radiology No results found.  Procedures Procedures (including critical care time)  Medications Ordered in UC Medications - No data to display  Initial Impression / Assessment and Plan / UC Course  I have reviewed the triage vital signs and the nursing notes.  Pertinent labs & imaging results that were available during my care of the patient were reviewed by me and considered in my medical decision making (see chart for details).     1.  Uncontrolled hypertension: Zestoretic prescription has been refilled Patient is advised to be compliant with his medications He needs to follow-up with his primary care physician on a regular basis Return precautions given. If patient experiences worsening headaches, chest pain, abdominal pain, dizziness or confusion he is advised to go to the ED to be evaluated.  Patient verbalizes understanding. Final Clinical Impressions(s) / UC Diagnoses   Final diagnoses:  Medication refill  Uncontrolled hypertension   Discharge Instructions   None    ED Prescriptions    Medication Sig Dispense Auth. Provider   HYDROcodone-acetaminophen (NORCO/VICODIN) 5-325 MG tablet  (Status: Discontinued) Take 2 tablets by mouth every 4 (four) hours as needed. 10 tablet Belem Hintze, Myrene Galas, MD   lisinopril-hydrochlorothiazide (ZESTORETIC) 10-12.5 MG tablet Take 1 tablet by mouth daily. 90 tablet Ellice Boultinghouse, Myrene Galas, MD     PDMP not reviewed this encounter.   Chase Picket, MD 07/27/19 1256

## 2019-07-27 NOTE — ED Triage Notes (Addendum)
Pt presents to UC for lisinopril-hydrochlorothiazide 10-12.5 mg refill. Pt took the last dose over a month ago.   Pt states having headache since last night. Pt denies dizziness, nausea, chest pain, diarrhea.

## 2020-05-09 ENCOUNTER — Encounter (HOSPITAL_COMMUNITY): Payer: Self-pay | Admitting: Emergency Medicine

## 2020-05-09 ENCOUNTER — Ambulatory Visit (HOSPITAL_COMMUNITY)
Admission: EM | Admit: 2020-05-09 | Discharge: 2020-05-09 | Disposition: A | Discharge status: Home / Self Care | Payer: BC Managed Care – PPO | Attending: Internal Medicine | Admitting: Internal Medicine

## 2020-05-09 ENCOUNTER — Other Ambulatory Visit: Payer: Self-pay

## 2020-05-09 DIAGNOSIS — J301 Allergic rhinitis due to pollen: Secondary | ICD-10-CM | POA: Diagnosis not present

## 2020-05-09 DIAGNOSIS — Z20822 Contact with and (suspected) exposure to covid-19: Secondary | ICD-10-CM | POA: Insufficient documentation

## 2020-05-09 LAB — SARS CORONAVIRUS 2 (TAT 6-24 HRS): SARS Coronavirus 2: NEGATIVE

## 2020-05-09 MED ORDER — FLUTICASONE PROPIONATE 50 MCG/ACT NA SUSP: 1.0000 | Freq: Every day | NASAL | 0 refills | Status: DC

## 2020-05-09 MED ORDER — LORATADINE 10 MG PO TABS: 10.0000 mg | ORAL_TABLET | Freq: Every day | ORAL | 1 refills | Status: AC

## 2020-05-09 MED ORDER — BENZONATATE 100 MG PO CAPS: 100.0000 mg | ORAL_CAPSULE | Freq: Three times a day (TID) | ORAL | 0 refills | Status: DC

## 2020-05-09 NOTE — ED Triage Notes (Signed)
Pt presents with sore throat, cough and headache xs 2 days. States has taken OTC medications with no relief.   Last dose of tylenol was last night before bed.

## 2020-05-09 NOTE — Discharge Instructions (Addendum)
Please take medications as directed Increase oral fluid intake Humidifier use will help with your symptoms Return to urgent care if you have worsening symptoms.

## 2020-05-11 NOTE — ED Provider Notes (Signed)
[facility]    CSN: [id number] Arrival date & time: 05/09/20  1138      History   Chief Complaint Chief Complaint  Patient presents with  . Cough  . Headache    HPI Steven Sullivan is a 45 y.o. male comes to urgent care with a 2-day history of sore throat, cough and global headache.  Symptoms started insidiously and has been persistent.  Patient denies any fever, chills nausea or vomiting.  No diarrhea.  Patient denies history of seasonal allergies.  No sick contacts.  Patient is not vaccinated against COVID-19 virus.  No loss of taste or smell.  No generalized body aches.  Patient endorses cough which is nonproductive.  He denies any shortness of breath or wheezing.  No chest pain or chest pressure. HPI  Past Medical History:  Diagnosis Date  . GERD (gastroesophageal reflux disease) 11/02/2011  . Hepatic steatosis 11/03/2011  . Hypertension     Patient Active Problem List   Diagnosis Date Noted  . Cough 05/22/2016  . Acute bronchitis 05/22/2016  . Abnormal CT of the abdomen 03/09/2016  . Abdominal pain 03/09/2016  . Leukocytosis 03/09/2016  . Hepatic steatosis 11/03/2011  . Hypertension, uncontrolled 11/02/2011  . Chest pain, negative MI, secondary to uncontrolled HTN vs. dyspepsia   11/02/2011  . GERD (gastroesophageal reflux disease) 11/02/2011    History reviewed. No pertinent surgical history.     Home Medications    Prior to Admission medications   Medication Sig Start Date End Date Taking? Authorizing Provider  benzonatate (TESSALON) 100 MG capsule Take 1 capsule (100 mg total) by mouth every 8 (eight) hours. 05/09/20  Yes Panayiota Larkin, Britta Mccreedy, MD  fluticasone (FLONASE) 50 MCG/ACT nasal spray Place 1 spray into both nostrils daily. 05/09/20  Yes Kadarrius Yanke, Britta Mccreedy, MD  loratadine (CLARITIN) 10 MG tablet Take 1 tablet (10 mg total) by mouth daily. 05/09/20  Yes Shaylie Eklund, Britta Mccreedy, MD  lisinopril-hydrochlorothiazide (ZESTORETIC) 10-12.5 MG tablet Take 1 tablet by  mouth daily. 07/27/19   Merrilee Jansky, MD  omeprazole (PRILOSEC) 20 MG capsule TAKE 1 CAPSULE(20 MG) BY MOUTH DAILY 03/20/17   Wallis Bamberg, PA-C    Family History History reviewed. No pertinent family history.  Social History Social History   Tobacco Use  . Smoking status: Never Smoker  . Smokeless tobacco: Never Used  Vaping Use  . Vaping Use: Never used  Substance Use Topics  . Alcohol use: No  . Drug use: No     Allergies   Patient has no known allergies.   Review of Systems Review of Systems  HENT: Positive for congestion and sore throat. Negative for postnasal drip, sinus pressure and sinus pain.   Eyes: Negative.   Respiratory: Negative.   Gastrointestinal: Negative.   Genitourinary: Negative.   Musculoskeletal: Positive for arthralgias and myalgias. Negative for joint swelling.  Skin: Negative.   Neurological: Negative for dizziness and headaches.     Physical Exam Triage Vital Signs ED Triage Vitals  Enc Vitals Group     BP 05/09/20 1210 128/75     Pulse Rate 05/09/20 1210 (!) 119     Resp --      Temp 05/09/20 1210 99.4 F (37.4 C)     Temp Source 05/09/20 1210 Oral     SpO2 05/09/20 1210 97 %     Weight --      Height --      Head Circumference --      Peak Flow --  Pain Score 05/09/20 1208 0     Pain Loc --      Pain Edu? --      Excl. in GC? --    No data found.  Updated Vital Signs BP 128/75 (BP Location: Right Arm)   Pulse (!) 119   Temp 99.4 F (37.4 C) (Oral)   SpO2 97%   Visual Acuity Right Eye Distance:   Left Eye Distance:   Bilateral Distance:    Right Eye Near:   Left Eye Near:    Bilateral Near:     Physical Exam Vitals and nursing note reviewed.  Constitutional:      Appearance: He is not ill-appearing.  Cardiovascular:     Rate and Rhythm: Normal rate and regular rhythm.     Heart sounds: Normal heart sounds.  Pulmonary:     Effort: Pulmonary effort is normal.     Breath sounds: Normal breath sounds.   Abdominal:     General: Bowel sounds are normal.     Palpations: Abdomen is soft.  Neurological:     Mental Status: He is alert.      UC Treatments / Results  Labs (all labs ordered are listed, but only abnormal results are displayed) Labs Reviewed  SARS CORONAVIRUS 2 (TAT 6-24 HRS)    EKG   Radiology No results found.  Procedures Procedures (including critical care time)  Medications Ordered in UC Medications - No data to display  Initial Impression / Assessment and Plan / UC Course  I have reviewed the triage vital signs and the nursing notes.  Pertinent labs & imaging results that were available during my care of the patient were reviewed by me and considered in my medical decision making (see chart for details).     1.  Allergic rhinitis: COVID-19 PCR test has been sent Patient is advised to quarantine until COVID-19 test results are available Fluticasone nasal spray Tessalon Perles as needed for cough Claritin 10 mg orally daily Humidifier use is advised Return to urgent care if symptoms worsen. Final Clinical Impressions(s) / UC Diagnoses   Final diagnoses:  Allergic rhinitis due to pollen, unspecified seasonality     Discharge Instructions     Please take medications as directed Increase oral fluid intake Humidifier use will help with your symptoms Return to urgent care if you have worsening symptoms.   ED Prescriptions    Medication Sig Dispense Auth. Provider   fluticasone (FLONASE) 50 MCG/ACT nasal spray Place 1 spray into both nostrils daily. 16 g Audreena Sachdeva, Britta Mccreedy, MD   benzonatate (TESSALON) 100 MG capsule Take 1 capsule (100 mg total) by mouth every 8 (eight) hours. 21 capsule Blondine Hottel, Britta Mccreedy, MD   loratadine (CLARITIN) 10 MG tablet Take 1 tablet (10 mg total) by mouth daily. 30 tablet Onesha Krebbs, Britta Mccreedy, MD     PDMP not reviewed this encounter.   Merrilee Jansky, MD 05/11/20 [phone]

## 2020-10-05 ENCOUNTER — Encounter (HOSPITAL_COMMUNITY): Payer: Self-pay

## 2020-10-05 ENCOUNTER — Ambulatory Visit (HOSPITAL_COMMUNITY)
Admission: EM | Admit: 2020-10-05 | Discharge: 2020-10-05 | Disposition: A | Discharge status: Home / Self Care | Payer: BC Managed Care – PPO | Attending: Internal Medicine | Admitting: Internal Medicine

## 2020-10-05 ENCOUNTER — Other Ambulatory Visit: Payer: Self-pay

## 2020-10-05 DIAGNOSIS — I10 Essential (primary) hypertension: Secondary | ICD-10-CM | POA: Diagnosis present

## 2020-10-05 DIAGNOSIS — J069 Acute upper respiratory infection, unspecified: Secondary | ICD-10-CM | POA: Insufficient documentation

## 2020-10-05 DIAGNOSIS — U071 COVID-19: Secondary | ICD-10-CM | POA: Diagnosis not present

## 2020-10-05 MED ORDER — ACETAMINOPHEN 325 MG PO TABS
ORAL_TABLET | ORAL | Status: AC
  Filled 2020-10-05: qty 2

## 2020-10-05 MED ORDER — ACETAMINOPHEN 325 MG PO TABS
650.0000 mg | ORAL_TABLET | Freq: Once | ORAL | Status: AC
  Administered 2020-10-05: 650 mg via ORAL

## 2020-10-05 MED ORDER — BENZONATATE 100 MG PO CAPS: 100.0000 mg | ORAL_CAPSULE | Freq: Three times a day (TID) | ORAL | 0 refills | Status: DC

## 2020-10-05 MED ORDER — LISINOPRIL-HYDROCHLOROTHIAZIDE 10-12.5 MG PO TABS: 1.0000 | ORAL_TABLET | Freq: Every day | ORAL | 0 refills | Status: DC

## 2020-10-05 MED ORDER — FLUTICASONE PROPIONATE 50 MCG/ACT NA SUSP: 1.0000 | Freq: Every day | NASAL | 0 refills | Status: DC

## 2020-10-05 NOTE — ED Triage Notes (Addendum)
Pt c/o headache, cough, chills, congestion, and headache. Pt also requesting refill of lisinopril. Started: last night

## 2020-10-05 NOTE — ED Provider Notes (Signed)
[facility]    CSN: [id number] Arrival date & time: 10/05/20  1412      History   Chief Complaint Chief Complaint  Patient presents with   Cough   Chills    HPI Steven Sullivan is a 45 y.o. male.   HPI  Cold Symptoms: Patient reports that for the past 2 days he has had cough and chills.  He also reports having a fever at home with unknown temp.  He does not have any chest pain, shortness of breath or vomiting.  He has not tried anything for symptoms.  No known sick contacts.  He does ask for refill of his blood pressure medication as he is about to be out.  Past Medical History:  Diagnosis Date   GERD (gastroesophageal reflux disease) 11/02/2011   Hepatic steatosis 11/03/2011   Hypertension     Patient Active Problem List   Diagnosis Date Noted   Cough 05/22/2016   Acute bronchitis 05/22/2016   Abnormal CT of the abdomen 03/09/2016   Abdominal pain 03/09/2016   Leukocytosis 03/09/2016   Hepatic steatosis 11/03/2011   Hypertension, uncontrolled 11/02/2011   Chest pain, negative MI, secondary to uncontrolled HTN vs. dyspepsia   11/02/2011   GERD (gastroesophageal reflux disease) 11/02/2011    History reviewed. No pertinent surgical history.     Home Medications    Prior to Admission medications   Medication Sig Start Date End Date Taking? Authorizing Provider  benzonatate (TESSALON) 100 MG capsule Take 1 capsule (100 mg total) by mouth every 8 (eight) hours. 10/05/20   Rushie Chestnut, PA-C  fluticasone (FLONASE) 50 MCG/ACT nasal spray Place 1 spray into both nostrils daily. 10/05/20   Rushie Chestnut, PA-C  lisinopril-hydrochlorothiazide (ZESTORETIC) 10-12.5 MG tablet Take 1 tablet by mouth daily. 07/27/19   Merrilee Jansky, MD  loratadine (CLARITIN) 10 MG tablet Take 1 tablet (10 mg total) by mouth daily. 05/09/20   Merrilee Jansky, MD  omeprazole (PRILOSEC) 20 MG capsule TAKE 1 CAPSULE(20 MG) BY MOUTH DAILY 03/20/17   Wallis Bamberg, PA-C    Family  History History reviewed. No pertinent family history.  Social History Social History   Tobacco Use   Smoking status: Never   Smokeless tobacco: Never  Vaping Use   Vaping Use: Never used  Substance Use Topics   Alcohol use: No   Drug use: No     Allergies   Patient has no known allergies.   Review of Systems Review of Systems  As stated above in HPI Physical Exam Triage Vital Signs ED Triage Vitals  Enc Vitals Group     BP 10/05/20 1601 (!) 166/100     Pulse Rate 10/05/20 1601 (!) 126     Resp 10/05/20 1601 20     Temp 10/05/20 1601 (!) 102.9 F (39.4 C)     Temp Source 10/05/20 1601 Oral     SpO2 10/05/20 1601 97 %     Weight --      Height --      Head Circumference --      Peak Flow --      Pain Score 10/05/20 1557 8     Pain Loc --      Pain Edu? --      Excl. in [place]? --    No data found.  Updated Vital Signs BP (!) 166/100 (BP Location: Right Arm)   Pulse (!) 126   Temp (!) 102.9 F (39.4 C) (Oral)  Resp 20   SpO2 97%   Physical Exam Vitals and nursing note reviewed.  Constitutional:      Appearance: Normal appearance. He is ill-appearing.  HENT:     Head: Normocephalic and atraumatic.     Right Ear: Tympanic membrane normal.     Left Ear: Tympanic membrane normal.     Nose: Congestion and rhinorrhea present.     Mouth/Throat:     Mouth: Mucous membranes are moist.     Pharynx: No oropharyngeal exudate or posterior oropharyngeal erythema.  Eyes:     Extraocular Movements: Extraocular movements intact.     Pupils: Pupils are equal, round, and reactive to light.  Cardiovascular:     Rate and Rhythm: Regular rhythm. Tachycardia present.     Pulses: Normal pulses.     Heart sounds: Normal heart sounds.  Pulmonary:     Effort: Pulmonary effort is normal.     Breath sounds: Normal breath sounds.  Musculoskeletal:     Cervical back: Normal range of motion and neck supple.  Lymphadenopathy:     Cervical: No cervical adenopathy.  Skin:     General: Skin is warm.  Neurological:     Mental Status: He is alert and oriented to person, place, and time.     UC Treatments / Results  Labs (all labs ordered are listed, but only abnormal results are displayed) Labs Reviewed  SARS CORONAVIRUS 2 (TAT 6-24 HRS)    EKG   Radiology No results found.  Procedures Procedures (including critical care time)  Medications Ordered in UC Medications  acetaminophen (TYLENOL) tablet 650 mg (650 mg Oral Given 10/05/20 1605)    Initial Impression / Assessment and Plan / UC Course  I have reviewed the triage vital signs and the nursing notes.  Pertinent labs & imaging results that were available during my care of the patient were reviewed by me and considered in my medical decision making (see chart for details).     New.  Patient declined translator and is able to answer all of my questions appropriately and repeat back directions.  Likely viral in nature.  COVID-19 swab pending given current outbreak and symptoms.  Patient was given Tylenol in office.  He has been recommended to rest, hydrate and monitor oxygen at home with a pulse oximeter. Final Clinical Impressions(s) / UC Diagnoses   Final diagnoses:  URI with cough and congestion   Discharge Instructions   None    ED Prescriptions     Medication Sig Dispense Auth. Provider   benzonatate (TESSALON) 100 MG capsule Take 1 capsule (100 mg total) by mouth every 8 (eight) hours. 21 capsule Daulton Harbaugh M, PA-C   fluticasone [facility]) 50 MCG/ACT nasal spray Place 1 spray into both nostrils daily. 16 g Jameon Deller M, [place]      PDMP not reviewed this encounter.   Rushie Chestnut, [place] 10/05/20 1652

## 2020-10-06 LAB — SARS CORONAVIRUS 2 (TAT 6-24 HRS): SARS Coronavirus 2: POSITIVE — AB

## 2020-11-30 ENCOUNTER — Other Ambulatory Visit: Payer: Self-pay

## 2020-11-30 ENCOUNTER — Emergency Department (HOSPITAL_COMMUNITY): Payer: BC Managed Care – PPO

## 2020-11-30 ENCOUNTER — Emergency Department (HOSPITAL_COMMUNITY)
Admission: EM | Admit: 2020-11-30 | Discharge: 2020-11-30 | Disposition: A | Discharge status: Home / Self Care | Payer: BC Managed Care – PPO | Attending: Emergency Medicine | Admitting: Emergency Medicine

## 2020-11-30 DIAGNOSIS — I10 Essential (primary) hypertension: Secondary | ICD-10-CM | POA: Insufficient documentation

## 2020-11-30 DIAGNOSIS — R0789 Other chest pain: Secondary | ICD-10-CM | POA: Insufficient documentation

## 2020-11-30 DIAGNOSIS — M546 Pain in thoracic spine: Secondary | ICD-10-CM | POA: Diagnosis not present

## 2020-11-30 DIAGNOSIS — R079 Chest pain, unspecified: Secondary | ICD-10-CM

## 2020-11-30 DIAGNOSIS — Z79899 Other long term (current) drug therapy: Secondary | ICD-10-CM | POA: Insufficient documentation

## 2020-11-30 DIAGNOSIS — M549 Dorsalgia, unspecified: Secondary | ICD-10-CM

## 2020-11-30 DIAGNOSIS — R52 Pain, unspecified: Secondary | ICD-10-CM

## 2020-11-30 LAB — CBC WITH DIFFERENTIAL/PLATELET
Abs Immature Granulocytes: 0.04 10*3/uL (ref 0.00–0.07)
Basophils Absolute: 0.1 10*3/uL (ref 0.0–0.1)
Basophils Relative: 1 %
Eosinophils Absolute: 0.3 10*3/uL (ref 0.0–0.5)
Eosinophils Relative: 3 %
HCT: 43 % (ref 39.0–52.0)
Hemoglobin: 13.7 g/dL (ref 13.0–17.0)
Immature Granulocytes: 0 %
Lymphocytes Relative: 19 %
Lymphs Abs: 1.9 10*3/uL (ref 0.7–4.0)
MCH: 26.2 pg (ref 26.0–34.0)
MCHC: 31.9 g/dL (ref 30.0–36.0)
MCV: 82.4 fL (ref 80.0–100.0)
Monocytes Absolute: 0.9 10*3/uL (ref 0.1–1.0)
Monocytes Relative: 9 %
Neutro Abs: 6.7 10*3/uL (ref 1.7–7.7)
Neutrophils Relative %: 68 %
Platelets: 245 10*3/uL (ref 150–400)
RBC: 5.22 MIL/uL (ref 4.22–5.81)
RDW: 12.9 % (ref 11.5–15.5)
WBC: 9.8 10*3/uL (ref 4.0–10.5)
nRBC: 0 % (ref 0.0–0.2)

## 2020-11-30 LAB — COMPREHENSIVE METABOLIC PANEL
ALT: 31 U/L (ref 0–44)
AST: 28 U/L (ref 15–41)
Albumin: 4 g/dL (ref 3.5–5.0)
Alkaline Phosphatase: 53 U/L (ref 38–126)
Anion gap: 7 (ref 5–15)
BUN: 10 mg/dL (ref 6–20)
CO2: 26 mmol/L (ref 22–32)
Calcium: 8.8 mg/dL — ABNORMAL LOW (ref 8.9–10.3)
Chloride: 104 mmol/L (ref 98–111)
Creatinine, Ser: 0.86 mg/dL (ref 0.61–1.24)
GFR, Estimated: >60 mL/min (ref >60)
Glucose, Bld: 96 mg/dL (ref 70–99)
Potassium: 3.9 mmol/L (ref 3.5–5.1)
Sodium: 137 mmol/L (ref 135–145)
Total Bilirubin: 0.5 mg/dL (ref 0.3–1.2)
Total Protein: 7.6 g/dL (ref 6.5–8.1)

## 2020-11-30 LAB — TROPONIN I (HIGH SENSITIVITY)
Troponin I (High Sensitivity): 3 ng/L (ref <18)
Troponin I (High Sensitivity): 3 ng/L (ref <18)

## 2020-11-30 MED ORDER — IOHEXOL 350 MG/ML SOLN
80.0000 mL | Freq: Once | INTRAVENOUS | Status: AC | PRN
  Administered 2020-11-30: 80 mL via INTRAVENOUS

## 2020-11-30 MED ORDER — LIDOCAINE 5 % EX PTCH
1.0000 | MEDICATED_PATCH | CUTANEOUS | Status: DC
  Administered 2020-11-30: 1 via TRANSDERMAL
  Filled 2020-11-30: qty 1

## 2020-11-30 MED ORDER — NAPROXEN 375 MG PO TABS: 375.0000 mg | ORAL_TABLET | Freq: Two times a day (BID) | ORAL | 0 refills | Status: AC | PRN

## 2020-11-30 MED ORDER — LISINOPRIL 20 MG PO TABS
20.0000 mg | ORAL_TABLET | Freq: Once | ORAL | Status: AC
  Administered 2020-11-30: 20 mg via ORAL
  Filled 2020-11-30: qty 1

## 2020-11-30 MED ORDER — LIDOCAINE 5 % EX PTCH: 1.0000 | MEDICATED_PATCH | CUTANEOUS | 0 refills | Status: AC

## 2020-11-30 MED ORDER — CYCLOBENZAPRINE HCL 7.5 MG PO TABS: 7.5000 mg | ORAL_TABLET | Freq: Three times a day (TID) | ORAL | 0 refills | Status: AC | PRN

## 2020-11-30 MED ORDER — HYDRALAZINE HCL 20 MG/ML IJ SOLN
5.0000 mg | Freq: Once | INTRAMUSCULAR | Status: AC
  Administered 2020-11-30: 5 mg via INTRAVENOUS
  Filled 2020-11-30: qty 1

## 2020-11-30 MED ORDER — MORPHINE SULFATE (PF) 4 MG/ML IV SOLN
4.0000 mg | Freq: Once | INTRAVENOUS | Status: AC
  Administered 2020-11-30: 4 mg via INTRAVENOUS
  Filled 2020-11-30: qty 1

## 2020-11-30 MED ORDER — LABETALOL HCL 5 MG/ML IV SOLN
10.0000 mg | Freq: Once | INTRAVENOUS | Status: AC
  Administered 2020-11-30: 10 mg via INTRAVENOUS
  Filled 2020-11-30: qty 4

## 2020-11-30 NOTE — Discharge Instructions (Signed)
You have been seen and discharged from the emergency department.  Your heart work-up was normal.  The CAT scan of your chest/abdomen/pelvis and spine showed no acute finding.  I believe you are suffering from musculoskeletal strain/injury.  Use pain patches as directed.  Take pain medicine as needed.  Use muscle relaxer as needed.  Do not mix this muscle relaxer medication with alcohol or other sedating medications. Do not drive or do heavy physical activity and to know how this medication affects you.  It may cause drowsiness.  Follow-up with your primary provider for reevaluation and further care. Take home medications as prescribed. If you have any worsening symptoms or further concerns for your health please return to an emergency department for further evaluation.

## 2020-11-30 NOTE — ED Triage Notes (Signed)
Pt arrives via EMS from urgent care for chest pain and SOB that started this morning while cutting trees. The chest pain radiates to upper back.took motrin at home with no releif. Given 324 ASA at urgent care. HTN in triage. Has not taken home lisinopril in several days due to medication running out.

## 2020-11-30 NOTE — ED Notes (Signed)
Help get patient on the monitor did ekg shown to er provder patient is resting with call bell in reach

## 2020-11-30 NOTE — ED Provider Notes (Signed)
[facility] EMERGENCY DEPARTMENT Provider Note   CSN: [id number] Arrival date & time: 11/30/20  1408     History Chief Complaint  Patient presents with   Chest Pain   Shortness of Breath    Steven Sullivan is a 45 y.o. male.  HPI  45 year old male with past medical history of HTN presents the emergency department with chest and upper back pain.  Patient states he is supposed to be taking lisinopril for his high blood pressure.  He ran out of refills and has not taken it in at least a couple weeks.  Earlier this morning while he was cutting trees the patient states that he developed midsternal chest discomfort that radiated through to his back between his shoulder blades.  This was not relieved with rest. He developed shortness of breath which is what prompted him to go to urgent care who has referred him here.  Currently he is complaining of middle upper and left lower back pain, somewhat worse with movement. SOB improved at rest.  Patient denies any history of CAD.  Past Medical History:  Diagnosis Date   GERD (gastroesophageal reflux disease) 11/02/2011   Hepatic steatosis 11/03/2011   Hypertension     Patient Active Problem List   Diagnosis Date Noted   Cough 05/22/2016   Acute bronchitis 05/22/2016   Abnormal CT of the abdomen 03/09/2016   Abdominal pain 03/09/2016   Leukocytosis 03/09/2016   Hepatic steatosis 11/03/2011   Hypertension, uncontrolled 11/02/2011   Chest pain, negative MI, secondary to uncontrolled HTN vs. dyspepsia   11/02/2011   GERD (gastroesophageal reflux disease) 11/02/2011    No past surgical history on file.     No family history on file.  Social History   Tobacco Use   Smoking status: Never   Smokeless tobacco: Never  Vaping Use   Vaping Use: Never used  Substance Use Topics   Alcohol use: No   Drug use: No    Home Medications Prior to Admission medications   Medication Sig Start Date End Date Taking? Authorizing  Provider  benzonatate (TESSALON) 100 MG capsule Take 1 capsule (100 mg total) by mouth every 8 (eight) hours. 10/05/20   Rushie Chestnut, PA-C  fluticasone (FLONASE) 50 MCG/ACT nasal spray Place 1 spray into both nostrils daily. 10/05/20   Rushie Chestnut, PA-C  lisinopril-hydrochlorothiazide (ZESTORETIC) 10-12.5 MG tablet Take 1 tablet by mouth daily. 10/05/20   Rushie Chestnut, PA-C  loratadine (CLARITIN) 10 MG tablet Take 1 tablet (10 mg total) by mouth daily. 05/09/20   Merrilee Jansky, MD  omeprazole (PRILOSEC) 20 MG capsule TAKE 1 CAPSULE(20 MG) BY MOUTH DAILY 03/20/17   Wallis Bamberg, PA-C    Allergies    Patient has no known allergies.  Review of Systems   Review of Systems  Constitutional:  Negative for chills and fever.  HENT:  Negative for congestion.   Eyes:  Negative for visual disturbance.  Respiratory:  Positive for shortness of breath.   Cardiovascular:  Positive for chest pain. Negative for palpitations and leg swelling.  Gastrointestinal:  Negative for abdominal pain, diarrhea and vomiting.  Genitourinary:  Negative for dysuria.  Musculoskeletal:  Positive for back pain.  Skin:  Negative for rash.  Neurological:  Negative for headaches.   Physical Exam Updated Vital Signs BP (!) 167/125   Pulse 83   Temp 97.7 F (36.5 C) (Oral)   Resp 18   Ht 4\' 11"  (1.499 m)   Wt 69  kg   SpO2 98%   BMI 30.72 kg/m   Physical Exam Vitals and nursing note reviewed.  Constitutional:      Appearance: Normal appearance.  HENT:     Head: Normocephalic.     Mouth/Throat:     Mouth: Mucous membranes are moist.  Cardiovascular:     Rate and Rhythm: Normal rate.  Pulmonary:     Effort: Pulmonary effort is normal. No respiratory distress.     Breath sounds: No decreased breath sounds.  Abdominal:     Palpations: Abdomen is soft.     Tenderness: There is no abdominal tenderness.  Musculoskeletal:     Right lower leg: No edema.     Left lower leg: No edema.      Comments: Slight reproducible TTP of mid left lateral back musculature, no overlying skin changes. Equal radial pulses  Skin:    General: Skin is warm.     Coloration: Skin is not cyanotic.  Neurological:     Mental Status: He is alert and oriented to person, place, and time. Mental status is at baseline.  Psychiatric:        Mood and Affect: Mood normal.    ED Results / Procedures / Treatments   Labs (all labs ordered are listed, but only abnormal results are displayed) Labs Reviewed  COMPREHENSIVE METABOLIC PANEL - Abnormal; Notable for the following components:      Result Value   Calcium 8.8 (*)    All other components within normal limits  CBC WITH DIFFERENTIAL/PLATELET  TROPONIN I (HIGH SENSITIVITY)  TROPONIN I (HIGH SENSITIVITY)    EKG EKG Interpretation  Date/Time:  Wednesday November 30 2020 14:12:46 EDT Ventricular Rate:  76 PR Interval:  159 QRS Duration: 96 QT Interval:  427 QTC Calculation: 481 R Axis:   91 Text Interpretation: Sinus rhythm Borderline right axis deviation Borderline prolonged QT interval NSR, similar to previous Confirmed by Coralee Pesa [phone]) on 11/30/2020 3:03:13 PM  Radiology DG Chest Port 1 View  Result Date: 11/30/2020 CLINICAL DATA:  Provided history: Pain. Additional history provided: Patient reports chest pain experienced when breathing and shortness of breath since this morning. History of hypertension and GERD. EXAM: PORTABLE CHEST 1 VIEW COMPARISON:  Prior chest radiographs 12/10/2013. CT angiogram chest 11/02/2011. FINDINGS: Please note a portion of the left lateral costophrenic angle is excluded from the field of view. Heart size at the upper limits of normal. No appreciable airspace consolidation or pulmonary edema. No evidence of pleural effusion or pneumothorax. No acute bony abnormality identified. IMPRESSION: Please note a portion of the left lateral costophrenic angle is excluded from the field of view. No evidence of acute  cardiopulmonary abnormality. Electronically Signed   By: Jackey Loge D.O.   On: 11/30/2020 15:23    Procedures Procedures   Medications Ordered in ED Medications  labetalol (NORMODYNE) injection 10 mg (10 mg Intravenous Given 11/30/20 1435)  lisinopril (ZESTRIL) tablet 20 mg (20 mg Oral Given 11/30/20 1438)  morphine 4 MG/ML injection 4 mg (4 mg Intravenous Given 11/30/20 1604)  hydrALAZINE (APRESOLINE) injection 5 mg (5 mg Intravenous Given 11/30/20 1604)    ED Course  I have reviewed the triage vital signs and the nursing notes.  Pertinent labs & imaging results that were available during my care of the patient were reviewed by me and considered in my medical decision making (see chart for details).    MDM Rules/Calculators/A&P  45 year old male presents emergency department with midsternal chest pain that radiates to his back and between his shoulder blades.  This started this morning when he was doing physical activity and cutting down trees outside.  He began to feel short of breath so he went to urgent care for evaluation who referred him here.  Mainly complaining of left mid back pain at this time.  No reproducible chest pain on exam.  There is tenderness to palpation of the left mid back musculature, no overlying skin changes.  Patient appeared uncomfortable in bed, complaining of continued chest pain going through to his back.  EKG is unchanged for the patient, troponins are negative x2 with no delta.  Low suspicion for ACS at this time.  CT dissection study was done which was unremarkable.  We reconstituted the spine that showed no acute fracture.  After medication the patient feels improved.  We will treat him for musculoskeletal back pain/injury.  Patient at this time appears safe and stable for discharge and will be treated as an outpatient.  Discharge plan and strict return to ED precautions discussed, patient verbalizes understanding and  agreement.  Final Clinical Impression(s) / ED Diagnoses Final diagnoses:  Pain    Rx / DC Orders ED Discharge Orders     None        Rozelle Logan, DO 11/30/20 1933

## 2022-01-27 ENCOUNTER — Encounter (HOSPITAL_COMMUNITY): Payer: Self-pay

## 2022-01-27 ENCOUNTER — Ambulatory Visit (HOSPITAL_COMMUNITY)
Admission: EM | Admit: 2022-01-27 | Discharge: 2022-01-27 | Disposition: A | Discharge status: Home / Self Care | Payer: BC Managed Care – PPO | Attending: Physician Assistant | Admitting: Physician Assistant

## 2022-01-27 DIAGNOSIS — J069 Acute upper respiratory infection, unspecified: Secondary | ICD-10-CM

## 2022-01-27 DIAGNOSIS — Z79899 Other long term (current) drug therapy: Secondary | ICD-10-CM | POA: Insufficient documentation

## 2022-01-27 DIAGNOSIS — Z1152 Encounter for screening for COVID-19: Secondary | ICD-10-CM | POA: Diagnosis not present

## 2022-01-27 DIAGNOSIS — J101 Influenza due to other identified influenza virus with other respiratory manifestations: Secondary | ICD-10-CM | POA: Diagnosis not present

## 2022-01-27 DIAGNOSIS — R059 Cough, unspecified: Secondary | ICD-10-CM | POA: Diagnosis not present

## 2022-01-27 LAB — RESP PANEL BY RT-PCR (FLU A&B, COVID) ARPGX2
Influenza A by PCR: POSITIVE — AB
Influenza B by PCR: NEGATIVE
SARS Coronavirus 2 by RT PCR: NEGATIVE

## 2022-01-27 MED ORDER — ALBUTEROL SULFATE HFA 108 (90 BASE) MCG/ACT IN AERS
1.0000 | INHALATION_SPRAY | Freq: Four times a day (QID) | RESPIRATORY_TRACT | 0 refills | Status: AC | PRN
Start: 2022-01-27 — End: ?

## 2022-01-27 MED ORDER — PROMETHAZINE-DM 6.25-15 MG/5ML PO SYRP
5.0000 mL | ORAL_SOLUTION | Freq: Four times a day (QID) | ORAL | 0 refills | Status: AC | PRN
Start: 2022-01-27 — End: ?

## 2022-01-27 NOTE — Discharge Instructions (Signed)
Take cough syrup as needed for cough Can use inhaler as needed for shortness of breath or wheezing.  Drink plenty of fluids, rest Can take Ibuprofen or Tylenol as needed for headache, body aches, or fever.  Can take Mucinex and Flonase.  Return if you develop new or worsening symptoms.

## 2022-01-27 NOTE — ED Provider Notes (Signed)
[facility]    CSN: [id number] Arrival date & time: 01/27/22  1241      History   Chief Complaint Chief Complaint  Patient presents with   Cough    HPI Steven Sullivan is a 46 y.o. male.   Patient complains of cough, congestion, body aches, chills that started yesterday.  Reports wife is similar with similar symptoms.  He is unsure if she is COVID-positive or flu positive.  He has been taking nothing for the symptoms.  Denies wheezing shortness of breath, nausea, vomiting, diarrhea.    Past Medical History:  Diagnosis Date   GERD (gastroesophageal reflux disease) 11/02/2011   Hepatic steatosis 11/03/2011   Hypertension     Patient Active Problem List   Diagnosis Date Noted   Cough 05/22/2016   Acute bronchitis 05/22/2016   Abnormal CT of the abdomen 03/09/2016   Abdominal pain 03/09/2016   Leukocytosis 03/09/2016   Hepatic steatosis 11/03/2011   Hypertension, uncontrolled 11/02/2011   Chest pain, negative MI, secondary to uncontrolled HTN vs. dyspepsia   11/02/2011   GERD (gastroesophageal reflux disease) 11/02/2011    History reviewed. No pertinent surgical history.     Home Medications    Prior to Admission medications   Medication Sig Start Date End Date Taking? Authorizing Provider  albuterol (VENTOLIN HFA) 108 (90 Base) MCG/ACT inhaler Inhale 1-2 puffs into the lungs every 6 (six) hours as needed for wheezing or shortness of breath. 01/27/22  Yes Ward, Tylene Fantasia, PA-C  promethazine-dextromethorphan (PROMETHAZINE-DM) 6.25-15 MG/5ML syrup Take 5 mLs by mouth 4 (four) times daily as needed for cough. 01/27/22  Yes Ward, Tylene Fantasia, PA-C  benzonatate (TESSALON) 100 MG capsule Take 1 capsule (100 mg total) by mouth every 8 (eight) hours. 10/05/20   Rushie Chestnut, PA-C  cyclobenzaprine (FEXMID) 7.5 MG tablet Take 1 tablet (7.5 mg total) by mouth 3 (three) times daily as needed for muscle spasms. 11/30/20   Horton, Kristie M, DO  fluticasone (FLONASE) 50  MCG/ACT nasal spray Place 1 spray into both nostrils daily. 10/05/20   Rushie Chestnut, PA-C  lidocaine (LIDODERM) 5 % Place 1 patch onto the skin daily. Remove & Discard patch within 12 hours or as directed by MD 11/30/20   Horton, Clabe Seal, DO  lisinopril-hydrochlorothiazide (ZESTORETIC) 10-12.5 MG tablet Take 1 tablet by mouth daily. 10/05/20   Rushie Chestnut, PA-C  loratadine (CLARITIN) 10 MG tablet Take 1 tablet (10 mg total) by mouth daily. 05/09/20   Merrilee Jansky, MD  naproxen (NAPROSYN) 375 MG tablet Take 1 tablet (375 mg total) by mouth 2 (two) times daily as needed. 11/30/20   Horton, Clabe Seal, DO  omeprazole (PRILOSEC) 20 MG capsule TAKE 1 CAPSULE(20 MG) BY MOUTH DAILY 03/20/17   Wallis Bamberg, PA-C    Family History History reviewed. No pertinent family history.  Social History Social History   Tobacco Use   Smoking status: Never   Smokeless tobacco: Never  Vaping Use   Vaping Use: Never used  Substance Use Topics   Alcohol use: No   Drug use: No     Allergies   Patient has no known allergies.   Review of Systems Review of Systems  Constitutional:  Positive for chills. Negative for fever.  HENT:  Positive for congestion. Negative for ear pain and sore throat.   Eyes:  Negative for pain and visual disturbance.  Respiratory:  Positive for cough. Negative for shortness of breath.   Cardiovascular:  Negative for  chest pain and palpitations.  Gastrointestinal:  Negative for abdominal pain and vomiting.  Genitourinary:  Negative for dysuria and hematuria.  Musculoskeletal:  Positive for myalgias. Negative for arthralgias and back pain.  Skin:  Negative for color change and rash.  Neurological:  Negative for seizures and syncope.  All other systems reviewed and are negative.    Physical Exam Triage Vital Signs ED Triage Vitals  Enc Vitals Group     BP 01/27/22 1504 (!) 125/93     Pulse Rate 01/27/22 1504 (!) 104     Resp 01/27/22 1504 16     Temp  01/27/22 1504 99.2 F (37.3 C)     Temp Source 01/27/22 1504 Oral     SpO2 01/27/22 1504 94 %     Weight --      Height --      Head Circumference --      Peak Flow --      Pain Score 01/27/22 1506 0     Pain Loc --      Pain Edu? --      Excl. in [place]? --    No data found.  Updated Vital Signs BP (!) 125/93 (BP Location: Left Arm)   Pulse (!) 104   Temp 99.2 F (37.3 C) (Oral)   Resp 16   SpO2 94%   Visual Acuity Right Eye Distance:   Left Eye Distance:   Bilateral Distance:    Right Eye Near:   Left Eye Near:    Bilateral Near:     Physical Exam Vitals and nursing note reviewed.  Constitutional:      General: He is not in acute distress.    Appearance: He is well-developed.  HENT:     Head: Normocephalic and atraumatic.  Eyes:     Conjunctiva/sclera: Conjunctivae normal.  Cardiovascular:     Rate and Rhythm: Normal rate and regular rhythm.     Heart sounds: No murmur heard. Pulmonary:     Effort: Pulmonary effort is normal. No respiratory distress.     Breath sounds: Normal breath sounds.  Abdominal:     Palpations: Abdomen is soft.     Tenderness: There is no abdominal tenderness.  Musculoskeletal:        General: No swelling.     Cervical back: Neck supple.  Skin:    General: Skin is warm and dry.     Capillary Refill: Capillary refill takes less than 2 seconds.  Neurological:     Mental Status: He is alert.  Psychiatric:        Mood and Affect: Mood normal.      UC Treatments / Results  Labs (all labs ordered are listed, but only abnormal results are displayed) Labs Reviewed  RESP PANEL BY RT-PCR (FLU A&B, COVID) [id number]    EKG   Radiology No results found.  Procedures Procedures (including critical care time)  Medications Ordered in UC Medications - No data to display  Initial Impression / Assessment and Plan / UC Course  I have reviewed the triage vital signs and the nursing notes.  Pertinent labs & imaging results that were  available during my care of the patient were reviewed by me and considered in my medical decision making (see chart for details).     URI, COVID/Flu test pending.  Patient, lungs clear, vitals within normal limits, stable for discharge with supportive care.  Consider Paxlovid or Tamiflu if COVID/flu test positive.  Return precautions discussed Final Clinical Impressions(s) /  UC Diagnoses   Final diagnoses:  Viral URI with cough     Discharge Instructions      Take cough syrup as needed for cough Can use inhaler as needed for shortness of breath or wheezing.  Drink plenty of fluids, rest Can take Ibuprofen or Tylenol as needed for headache, body aches, or fever.  Can take Mucinex and Flonase.  Return if you develop new or worsening symptoms.    ED Prescriptions     Medication Sig Dispense Auth. Provider   promethazine-dextromethorphan (PROMETHAZINE-DM) 6.25-15 MG/5ML syrup Take 5 mLs by mouth 4 (four) times daily as needed for cough. 118 mL Ward, Janett Billow Z, PA-C   albuterol (VENTOLIN HFA) 108 (90 Base) MCG/ACT inhaler Inhale 1-2 puffs into the lungs every 6 (six) hours as needed for wheezing or shortness of breath. 1 each Ward, Lenise Arena, PA-C      PDMP not reviewed this encounter.   Ward, Lenise Arena, PA-C 01/27/22 1549

## 2022-01-27 NOTE — ED Triage Notes (Signed)
Pt states cough since yesterday.

## 2022-01-28 ENCOUNTER — Telehealth (HOSPITAL_COMMUNITY): Payer: Self-pay | Admitting: Emergency Medicine

## 2022-01-28 MED ORDER — OSELTAMIVIR PHOSPHATE 75 MG PO CAPS: 75.0000 mg | ORAL_CAPSULE | Freq: Two times a day (BID) | ORAL | 0 refills | Status: AC

## 2022-03-14 ENCOUNTER — Ambulatory Visit
Admission: EM | Admit: 2022-03-14 | Discharge: 2022-03-14 | Disposition: A | Discharge status: Home / Self Care | Payer: BC Managed Care – PPO | Attending: Internal Medicine | Admitting: Internal Medicine

## 2022-03-14 DIAGNOSIS — R519 Headache, unspecified: Secondary | ICD-10-CM | POA: Diagnosis not present

## 2022-03-14 DIAGNOSIS — J069 Acute upper respiratory infection, unspecified: Secondary | ICD-10-CM | POA: Diagnosis not present

## 2022-03-14 DIAGNOSIS — Z1152 Encounter for screening for COVID-19: Secondary | ICD-10-CM | POA: Diagnosis not present

## 2022-03-14 MED ORDER — FLUTICASONE PROPIONATE 50 MCG/ACT NA SUSP: 1.0000 | Freq: Every day | NASAL | 0 refills | Status: AC

## 2022-03-14 MED ORDER — BENZONATATE 100 MG PO CAPS: 100.0000 mg | ORAL_CAPSULE | Freq: Three times a day (TID) | ORAL | 0 refills | Status: AC | PRN

## 2022-03-14 NOTE — ED Provider Notes (Signed)
[facility]    CSN: [id number] Arrival date & time: 03/14/22  1657      History   Chief Complaint Chief Complaint  Patient presents with   Headache    HPI Steven Sullivan is a 47 y.o. male.   Patient presents with headache, chills, nasal congestion, cough that started today.  Denies any known fevers or sick contacts.  Denies chest pain, shortness of breath, nausea, vomiting, diarrhea, abdominal pain.  Patient does not report medications for symptoms.  Denies history of asthma.   Headache   Past Medical History:  Diagnosis Date   GERD (gastroesophageal reflux disease) 11/02/2011   Hepatic steatosis 11/03/2011   Hypertension     Patient Active Problem List   Diagnosis Date Noted   Cough 05/22/2016   Acute bronchitis 05/22/2016   Abnormal CT of the abdomen 03/09/2016   Abdominal pain 03/09/2016   Leukocytosis 03/09/2016   Hepatic steatosis 11/03/2011   Hypertension, uncontrolled 11/02/2011   Chest pain, negative MI, secondary to uncontrolled HTN vs. dyspepsia   11/02/2011   GERD (gastroesophageal reflux disease) 11/02/2011    History reviewed. No pertinent surgical history.     Home Medications    Prior to Admission medications   Medication Sig Start Date End Date Taking? Authorizing Provider  benzonatate (TESSALON) 100 MG capsule Take 1 capsule (100 mg total) by mouth every 8 (eight) hours as needed for cough. 03/14/22  Yes Ramatoulaye Pack, Hildred Alamin E, FNP  fluticasone (FLONASE) 50 MCG/ACT nasal spray Place 1 spray into both nostrils daily. 03/14/22  Yes Jarl Sellitto, Michele Rockers, FNP  albuterol (VENTOLIN HFA) 108 (90 Base) MCG/ACT inhaler Inhale 1-2 puffs into the lungs every 6 (six) hours as needed for wheezing or shortness of breath. 01/27/22   Ward, Lenise Arena, PA-C  cyclobenzaprine (FEXMID) 7.5 MG tablet Take 1 tablet (7.5 mg total) by mouth 3 (three) times daily as needed for muscle spasms. 11/30/20   Horton, Alvin Critchley, DO  lidocaine (LIDODERM) 5 % Place 1 patch onto the skin  daily. Remove & Discard patch within 12 hours or as directed by MD 11/30/20   Horton, Alvin Critchley, DO  lisinopril-hydrochlorothiazide (ZESTORETIC) 10-12.5 MG tablet Take 1 tablet by mouth daily. 10/05/20   Hughie Closs, PA-C  loratadine (CLARITIN) 10 MG tablet Take 1 tablet (10 mg total) by mouth daily. 05/09/20   Chase Picket, MD  naproxen (NAPROSYN) 375 MG tablet Take 1 tablet (375 mg total) by mouth 2 (two) times daily as needed. 11/30/20   Horton, Alvin Critchley, DO  omeprazole (PRILOSEC) 20 MG capsule TAKE 1 CAPSULE(20 MG) BY MOUTH DAILY 03/20/17   Jaynee Eagles, PA-C  oseltamivir (TAMIFLU) 75 MG capsule Take 1 capsule (75 mg total) by mouth every 12 (twelve) hours. 01/28/22   Chase Picket, MD  promethazine-dextromethorphan (PROMETHAZINE-DM) 6.25-15 MG/5ML syrup Take 5 mLs by mouth 4 (four) times daily as needed for cough. 01/27/22   Ward, Lenise Arena, PA-C    Family History History reviewed. No pertinent family history.  Social History Social History   Tobacco Use   Smoking status: Never   Smokeless tobacco: Never  Vaping Use   Vaping Use: Never used  Substance Use Topics   Alcohol use: No   Drug use: No     Allergies   Patient has no known allergies.   Review of Systems Review of Systems Per HPI  Physical Exam Triage Vital Signs ED Triage Vitals  Enc Vitals Group     BP 03/14/22 1712 Marland Kitchen)  143/92     Pulse Rate 03/14/22 1712 86     Resp 03/14/22 1712 18     Temp 03/14/22 1712 97.8 F (36.6 C)     Temp Source 03/14/22 1712 Oral     SpO2 03/14/22 1712 96 %     Weight --      Height --      Head Circumference --      Peak Flow --      Pain Score 03/14/22 1713 0     Pain Loc --      Pain Edu? --      Excl. in [place]? --    No data found.  Updated Vital Signs BP (!) 143/92 (BP Location: Right Arm)   Pulse 86   Temp 97.8 F (36.6 C) (Oral)   Resp 18   SpO2 96%   Visual Acuity Right Eye Distance:   Left Eye Distance:   Bilateral Distance:    Right Eye  Near:   Left Eye Near:    Bilateral Near:     Physical Exam Constitutional:      General: He is not in acute distress.    Appearance: Normal appearance. He is not toxic-appearing or diaphoretic.  HENT:     Head: Normocephalic and atraumatic.     Right Ear: Tympanic membrane and ear canal normal.     Left Ear: Tympanic membrane and ear canal normal.     Nose: Congestion present.     Mouth/Throat:     Mouth: Mucous membranes are moist.     Pharynx: No posterior oropharyngeal erythema.  Eyes:     Extraocular Movements: Extraocular movements intact.     Conjunctiva/sclera: Conjunctivae normal.     Pupils: Pupils are equal, round, and reactive to light.  Cardiovascular:     Rate and Rhythm: Normal rate and regular rhythm.     Pulses: Normal pulses.     Heart sounds: Normal heart sounds.  Pulmonary:     Effort: Pulmonary effort is normal. No respiratory distress.     Breath sounds: Normal breath sounds. No stridor. No wheezing, rhonchi or rales.  Abdominal:     General: Abdomen is flat. Bowel sounds are normal.     Palpations: Abdomen is soft.  Musculoskeletal:        General: Normal range of motion.     Cervical back: Normal range of motion.  Skin:    General: Skin is warm and dry.  Neurological:     General: No focal deficit present.     Mental Status: He is alert and oriented to person, place, and time. Mental status is at baseline.     Cranial Nerves: Cranial nerves 2-12 are intact.     Sensory: Sensation is intact.     Motor: Motor function is intact.     Coordination: Coordination is intact.     Gait: Gait is intact.  Psychiatric:        Mood and Affect: Mood normal.        Behavior: Behavior normal.      UC Treatments / Results  Labs (all labs ordered are listed, but only abnormal results are displayed) Labs Reviewed  SARS CORONAVIRUS 2 (TAT 6-24 HRS)    EKG   Radiology No results found.  Procedures Procedures (including critical care  time)  Medications Ordered in UC Medications - No data to display  Initial Impression / Assessment and Plan / UC Course  I have reviewed the triage vital  signs and the nursing notes.  Pertinent labs & imaging results that were available during my care of the patient were reviewed by me and considered in my medical decision making (see chart for details).     Patient speaks [place].  We do not have an interpreter currently available.  Patient did speak some English but history was limited due to language barrier.  Symptoms and physical exam appear consistent with viral upper respiratory infection.  Patient presents with symptoms likely from a viral upper respiratory infection. Do not suspect underlying cardiopulmonary process. Symptoms seem unlikely related to ACS, CHF or COPD exacerbations, pneumonia, pneumothorax. Patient is nontoxic appearing and not in need of emergent medical intervention.  COVID test pending.  Recommended symptom control with medications and supportive care.  Patient sent prescriptions.  Patient reports that he only takes blood pressure medication and omeprazole so these medications should be safe.  Return if symptoms fail to improve in 1-2 weeks or you develop shortness of breath, chest pain, severe headache. Patient states understanding and is agreeable.  Discharged with PCP followup.  Final Clinical Impressions(s) / UC Diagnoses   Final diagnoses:  Viral upper respiratory tract infection with cough  Acute nonintractable headache, unspecified headache type     Discharge Instructions      It appears that you have a viral upper respiratory infection which should run its course and self resolve with symptomatic treatment as we discussed.  I have prescribed you 2 medications to help alleviate symptoms.  Please follow-up if symptoms persist or worsen.    ED Prescriptions     Medication Sig Dispense Auth. Provider   fluticasone (FLONASE) 50 MCG/ACT  nasal spray Place 1 spray into both nostrils daily. 16 g Rees Santistevan, Hildred Alamin E, [place]   benzonatate (TESSALON) 100 MG capsule Take 1 capsule (100 mg total) by mouth every 8 (eight) hours as needed for cough. 21 capsule [place], Michele Rockers, [place]      PDMP not reviewed this encounter.   Teodora Medici, [place] 03/14/22 1750

## 2022-03-14 NOTE — ED Triage Notes (Signed)
Pt c/o headache, feels cold, nasal problems  Denies increased coughing, pain in triage,   [place] Mode which our translation method does not have available at his time. Limited triage information 2/2 language barrier.

## 2022-03-14 NOTE — Discharge Instructions (Signed)
It appears that you have a viral upper respiratory infection which should run its course and self resolve with symptomatic treatment as we discussed.  I have prescribed you 2 medications to help alleviate symptoms.  Please follow-up if symptoms persist or worsen.

## 2022-03-15 LAB — SARS CORONAVIRUS 2 (TAT 6-24 HRS): SARS Coronavirus 2: NEGATIVE

## 2022-12-24 ENCOUNTER — Encounter: Payer: Self-pay | Admitting: *Deleted

## 2022-12-24 ENCOUNTER — Other Ambulatory Visit: Payer: Self-pay

## 2022-12-24 ENCOUNTER — Ambulatory Visit
Admission: EM | Admit: 2022-12-24 | Discharge: 2022-12-24 | Disposition: A | Discharge status: Home / Self Care | Payer: BC Managed Care – PPO | Attending: Internal Medicine | Admitting: Internal Medicine

## 2022-12-24 DIAGNOSIS — Z760 Encounter for issue of repeat prescription: Secondary | ICD-10-CM | POA: Diagnosis not present

## 2022-12-24 DIAGNOSIS — I10 Essential (primary) hypertension: Secondary | ICD-10-CM | POA: Diagnosis not present

## 2022-12-24 MED ORDER — LISINOPRIL-HYDROCHLOROTHIAZIDE 10-12.5 MG PO TABS: 1.0000 | ORAL_TABLET | Freq: Every day | ORAL | 0 refills | Status: AC

## 2022-12-24 NOTE — ED Provider Notes (Signed)
[facility]    CSN: [id number] Arrival date & time: 12/24/22  1605      History   Chief Complaint Chief Complaint  Patient presents with   Headache    HPI Steven Sullivan is a 47 y.o. male.   Patient presents today for medication refill for blood pressure medication.  He states that he has had a mild headache over the past few days which typically occurs when he runs out of his blood pressure medication.  He states he has been out of his medication for about a month.  He takes Zestoretic.  States that he has a follow-up appointment with primary care doctor in about 2 weeks.  Headache is present in the frontal portion of the head and is rated 2/10 on pain scale.  He does not report any dizziness, blurred vision, nausea, vomiting, chest pain, shortness of breath.  Patient previously took ibuprofen for headache with minimal improvement.   Headache   Past Medical History:  Diagnosis Date   GERD (gastroesophageal reflux disease) 11/02/2011   Hepatic steatosis 11/03/2011   Hypertension     Patient Active Problem List   Diagnosis Date Noted   Cough 05/22/2016   Acute bronchitis 05/22/2016   Abnormal CT of the abdomen 03/09/2016   Abdominal pain 03/09/2016   Leukocytosis 03/09/2016   Hepatic steatosis 11/03/2011   Hypertension, uncontrolled 11/02/2011   Chest pain, negative MI, secondary to uncontrolled HTN vs. dyspepsia   11/02/2011   GERD (gastroesophageal reflux disease) 11/02/2011    History reviewed. No pertinent surgical history.     Home Medications    Prior to Admission medications   Medication Sig Start Date End Date Taking? Authorizing Provider  albuterol (VENTOLIN HFA) 108 (90 Base) MCG/ACT inhaler Inhale 1-2 puffs into the lungs every 6 (six) hours as needed for wheezing or shortness of breath. 01/27/22   Ward, Tylene Fantasia, PA-C  benzonatate (TESSALON) 100 MG capsule Take 1 capsule (100 mg total) by mouth every 8 (eight) hours as needed for cough. 03/14/22    Gustavus Bryant, FNP  cyclobenzaprine (FEXMID) 7.5 MG tablet Take 1 tablet (7.5 mg total) by mouth 3 (three) times daily as needed for muscle spasms. 11/30/20   Horton, Kristie M, DO  fluticasone (FLONASE) 50 MCG/ACT nasal spray Place 1 spray into both nostrils daily. 03/14/22   Gustavus Bryant, FNP  lidocaine (LIDODERM) 5 % Place 1 patch onto the skin daily. Remove & Discard patch within 12 hours or as directed by MD 11/30/20   Horton, Clabe Seal, DO  lisinopril-hydrochlorothiazide (ZESTORETIC) 10-12.5 MG tablet Take 1 tablet by mouth daily. 12/24/22   Gustavus Bryant, FNP  loratadine (CLARITIN) 10 MG tablet Take 1 tablet (10 mg total) by mouth daily. 05/09/20   Merrilee Jansky, MD  naproxen (NAPROSYN) 375 MG tablet Take 1 tablet (375 mg total) by mouth 2 (two) times daily as needed. 11/30/20   Horton, Clabe Seal, DO  omeprazole (PRILOSEC) 20 MG capsule TAKE 1 CAPSULE(20 MG) BY MOUTH DAILY 03/20/17   Wallis Bamberg, PA-C  oseltamivir (TAMIFLU) 75 MG capsule Take 1 capsule (75 mg total) by mouth every 12 (twelve) hours. 01/28/22   Merrilee Jansky, MD  promethazine-dextromethorphan (PROMETHAZINE-DM) 6.25-15 MG/5ML syrup Take 5 mLs by mouth 4 (four) times daily as needed for cough. 01/27/22   Ward, Tylene Fantasia, PA-C    Family History History reviewed. No pertinent family history.  Social History Social History   Tobacco Use   Smoking status:  Never   Smokeless tobacco: Never  Vaping Use   Vaping status: Never Used  Substance Use Topics   Alcohol use: No   Drug use: No     Allergies   Patient has no known allergies.   Review of Systems Review of Systems Per HPI  Physical Exam Triage Vital Signs ED Triage Vitals  Encounter Vitals Group     BP 12/24/22 1704 (!) 169/126     Systolic BP Percentile --      Diastolic BP Percentile --      Pulse Rate 12/24/22 1704 (!) 101     Resp 12/24/22 1704 16     Temp 12/24/22 1704 98 F (36.7 C)     Temp Source 12/24/22 1704 Oral     SpO2 12/24/22  1704 97 %     Weight --      Height --      Head Circumference --      Peak Flow --      Pain Score 12/24/22 1702 2     Pain Loc --      Pain Education --      Exclude from Growth Chart --    No data found.  Updated Vital Signs BP (!) 169/126 (BP Location: Left Arm)   Pulse (!) 101   Temp 98 F (36.7 C) (Oral)   Resp 16   SpO2 97%   Visual Acuity Right Eye Distance:   Left Eye Distance:   Bilateral Distance:    Right Eye Near:   Left Eye Near:    Bilateral Near:     Physical Exam Constitutional:      General: He is not in acute distress.    Appearance: Normal appearance. He is not toxic-appearing or diaphoretic.  HENT:     Head: Normocephalic and atraumatic.  Eyes:     Extraocular Movements: Extraocular movements intact.     Conjunctiva/sclera: Conjunctivae normal.     Pupils: Pupils are equal, round, and reactive to light.  Cardiovascular:     Rate and Rhythm: Normal rate and regular rhythm.     Pulses: Normal pulses.     Heart sounds: Normal heart sounds.  Pulmonary:     Effort: Pulmonary effort is normal. No respiratory distress.     Breath sounds: Normal breath sounds.  Neurological:     General: No focal deficit present.     Mental Status: He is alert and oriented to person, place, and time. Mental status is at baseline.     Cranial Nerves: Cranial nerves 2-12 are intact.     Sensory: Sensation is intact.     Motor: Motor function is intact.     Coordination: Coordination is intact.     Gait: Gait is intact.  Psychiatric:        Mood and Affect: Mood normal.        Behavior: Behavior normal.        Thought Content: Thought content normal.        Judgment: Judgment normal.      UC Treatments / Results  Labs (all labs ordered are listed, but only abnormal results are displayed) Labs Reviewed - No data to display  EKG   Radiology No results found.  Procedures Procedures (including critical care time)  Medications Ordered in  UC Medications - No data to display  Initial Impression / Assessment and Plan / UC Course  I have reviewed the triage vital signs and the nursing notes.  Pertinent  labs & imaging results that were available during my care of the patient were reviewed by me and considered in my medical decision making (see chart for details).     Patient reporting headache which I do think is attributed to elevated blood pressure and patient being out of his blood pressure medication.  Headache is mild and neuroexam is normal so do not think that emergent evaluation or imaging of the head is necessary at this time.  Will refill patient's blood pressure medication at previously prescribed dose and patient advised to start taking immediately as prescribed.  Advised to monitor blood pressure at home with home blood pressure cuff as well.  Patient was given strict return and ER precautions if headache persists or worsens or if blood pressure remains elevated.  Patient already has appointment with PCP.  Offered patient BMP given patient takes Zestoretic but he wishes to have this blood work completed at PCP appointment in about 1 to 2 weeks.  Patient verbalized understanding and was agreeable with plan.  Patient declined interpreter today. Final Clinical Impressions(s) / UC Diagnoses   Final diagnoses:  Hypertension, unspecified type  Medication refill     Discharge Instructions      I have refilled your blood medication.  Keep scheduled appointment with primary care doctor for follow-up.  Monitor your blood pressure closely.  If blood pressure remains elevated or headache persists, please go to the emergency department.    ED Prescriptions     Medication Sig Dispense Auth. Provider   lisinopril-hydrochlorothiazide (ZESTORETIC) 10-12.5 MG tablet Take 1 tablet by mouth daily. 30 tablet [place], Acie Fredrickson, [place]      PDMP not reviewed this encounter.   Gustavus Bryant, [place] 12/24/22 [phone]

## 2022-12-24 NOTE — ED Triage Notes (Addendum)
Pt declined interpreter. C/o headache x 2 days and feeling sleepy. Denies fever; Pt declined interpreter, states it is not necessary

## 2022-12-24 NOTE — Discharge Instructions (Signed)
I have refilled your blood medication.  Keep scheduled appointment with primary care doctor for follow-up.  Monitor your blood pressure closely.  If blood pressure remains elevated or headache persists, please go to the emergency department.

## 2023-09-14 IMAGING — DX DG CHEST 1V PORT
1 series · 1 of 1 positions shown · non-contrast
Comparison: Prior chest radiographs 12/10/2013. CT angiogram chest
11/02/2011.

CLINICAL DATA: Provided history: Pain. Additional history provided:
Patient reports chest pain experienced when breathing and shortness
of breath since this morning. History of hypertension and GERD.

EXAM:
PORTABLE CHEST 1 VIEW

[chest ap]
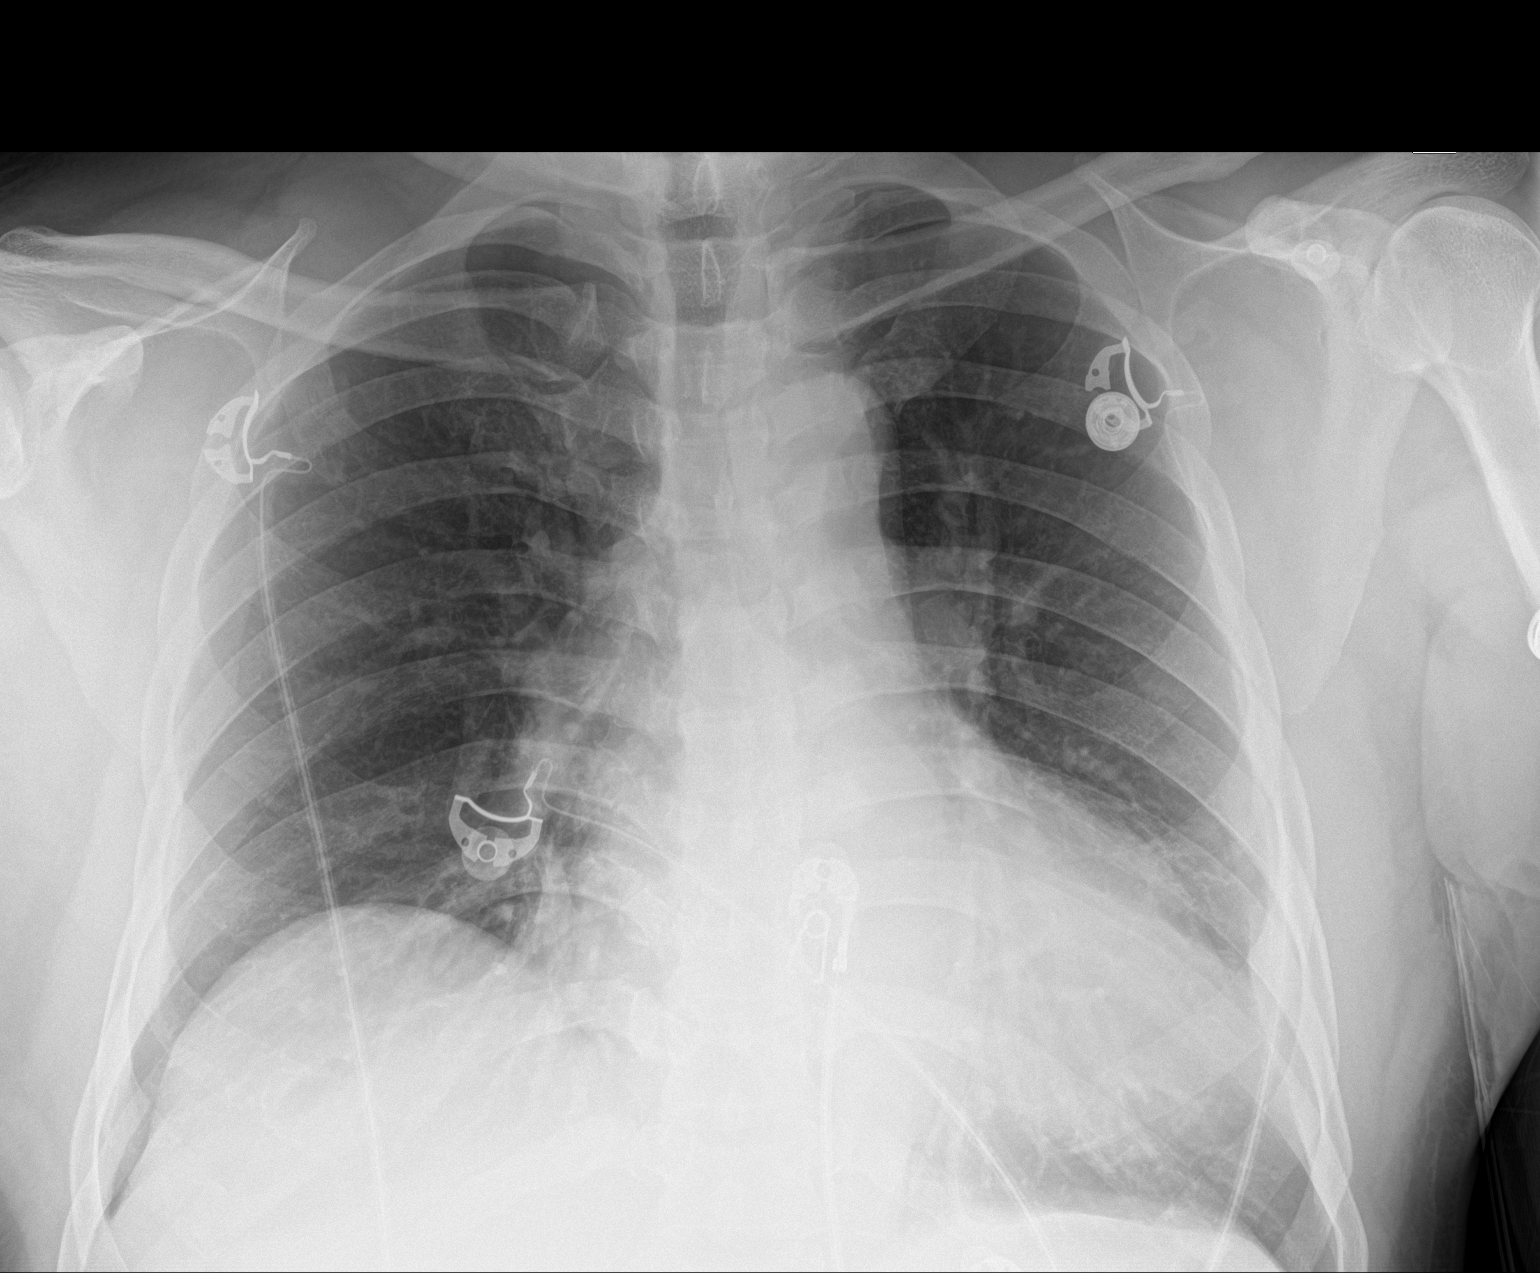

[1 of 1 positions shown; findings below may reference images not displayed]

FINDINGS: Please note a portion of the left lateral costophrenic angle is
excluded from the field of view. Heart size at the upper limits of
normal. No appreciable airspace consolidation or pulmonary edema. No
evidence of pleural effusion or pneumothorax. No acute bony
abnormality identified.
IMPRESSION: Please note a portion of the left lateral costophrenic angle is
excluded from the field of view.

No evidence of acute cardiopulmonary abnormality.

## 2023-09-14 IMAGING — CT CT ANGIO CHEST-ABD-PELV FOR DISSECTION W/ AND WO/W CM
2 of 7 series · 14 of 46 positions shown, 16 images · IV contrast (OMNI 350)
Comparison: None.

CLINICAL DATA: Chest pain or back pain, aortic dissection suspected

EXAM:
CT ANGIOGRAPHY CHEST, ABDOMEN AND PELVIS
TECHNIQUE: Non-contrast CT of the chest was initially obtained.

[Series 7: dissection 2mm · axial · 0.77mm/px · z∈[-612,-72]mm · 11 of 302 slices shown, 13 images]
[im 16/302  soft-tissue]
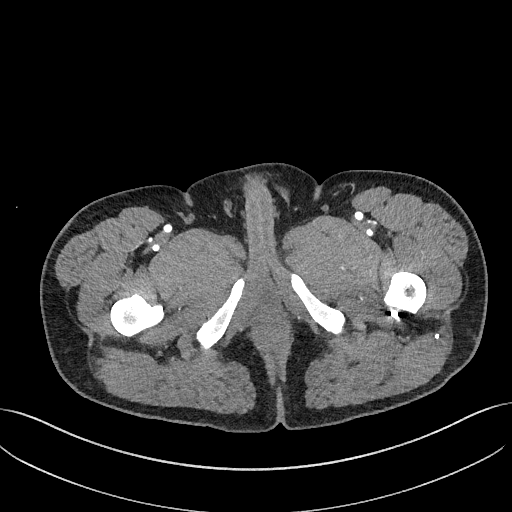
[im 16/302  bone]
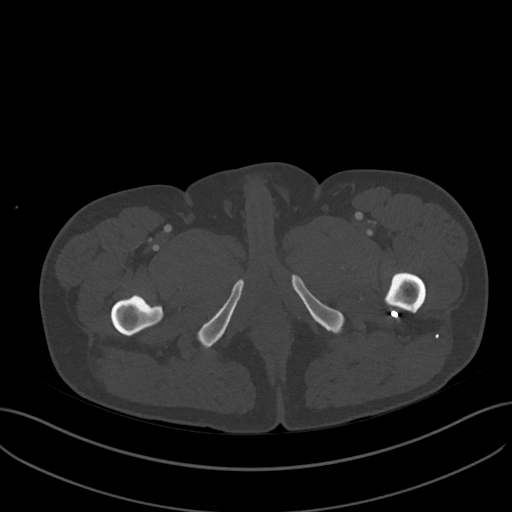
[im 48/302  soft-tissue]
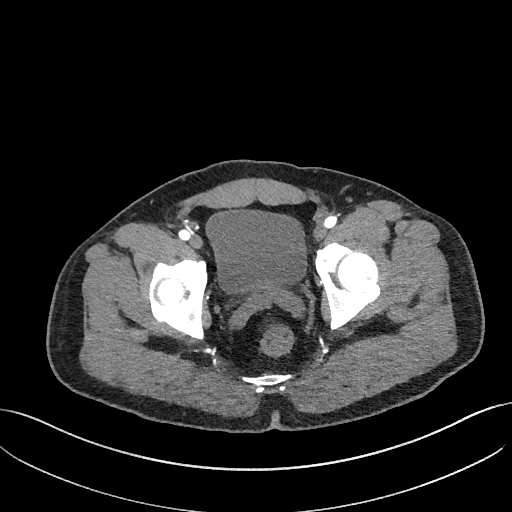
[im 80/302  soft-tissue]
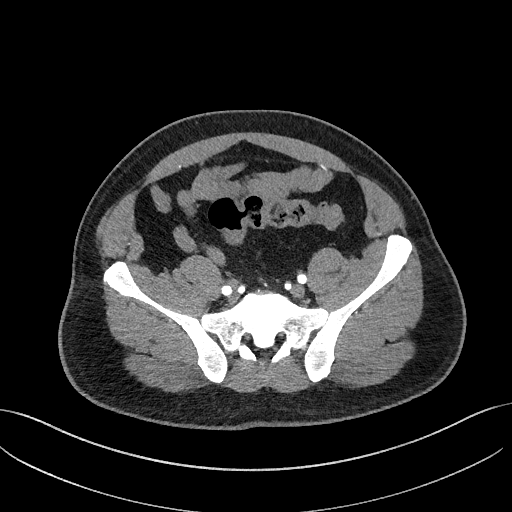
[im 96/302  soft-tissue]
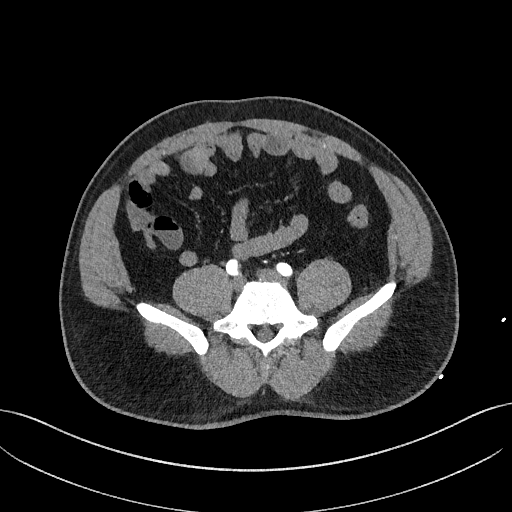
[im 127/302  soft-tissue]
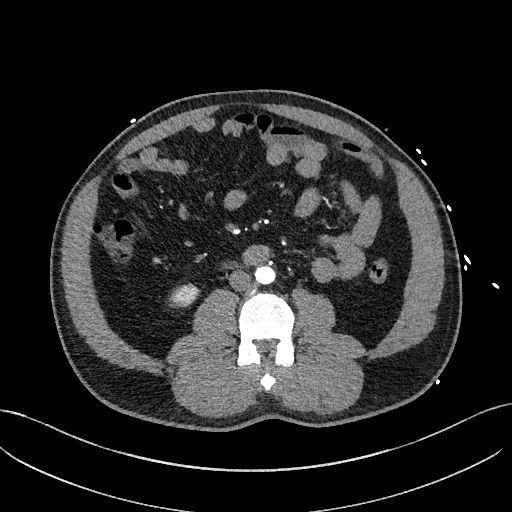
[im 159/302  soft-tissue]
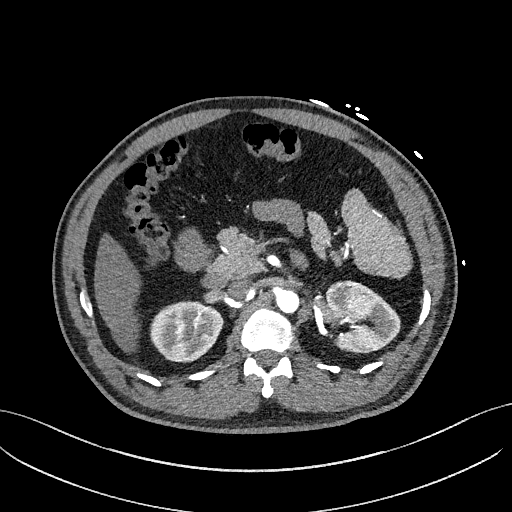
[im 175/302  soft-tissue]
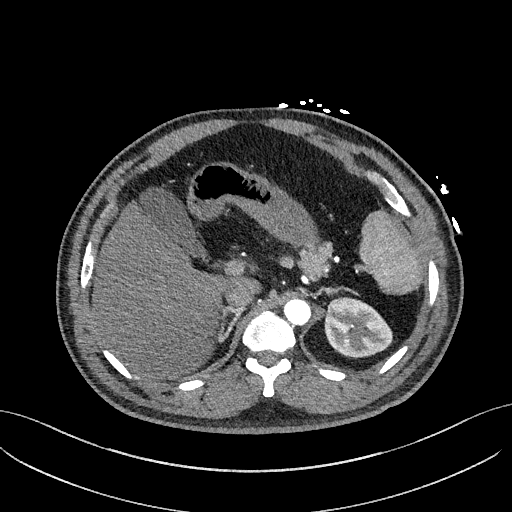
[im 206/302  soft-tissue]
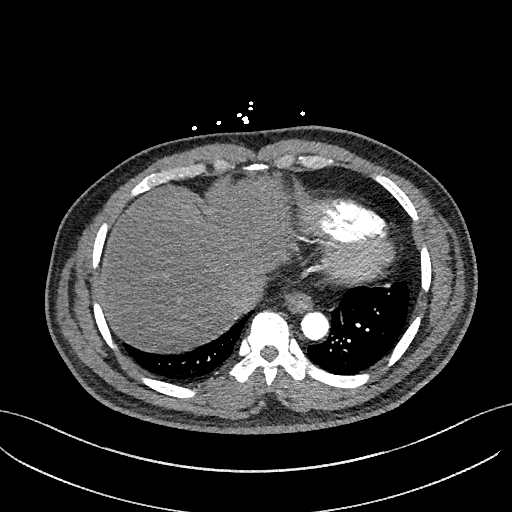
[im 222/302  soft-tissue]
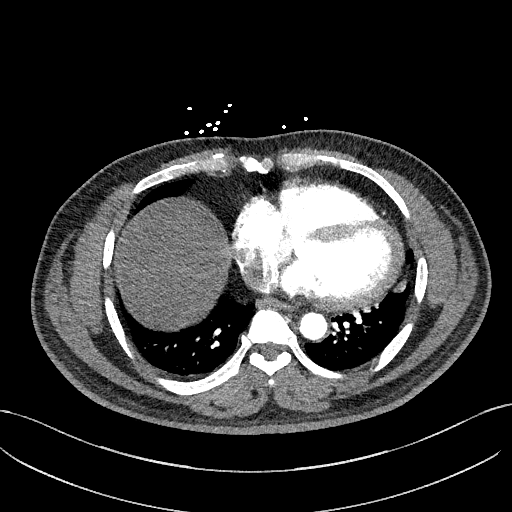
[im 222/302  bone]
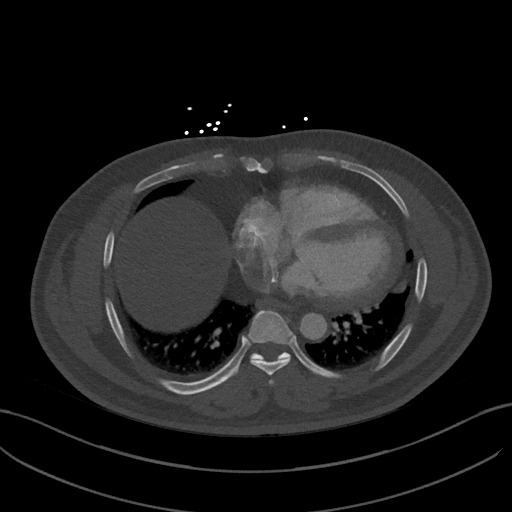
[im 254/302  soft-tissue]
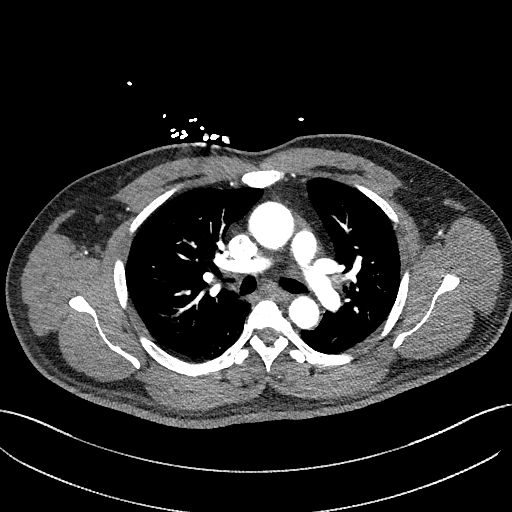
[im 286/302  soft-tissue]
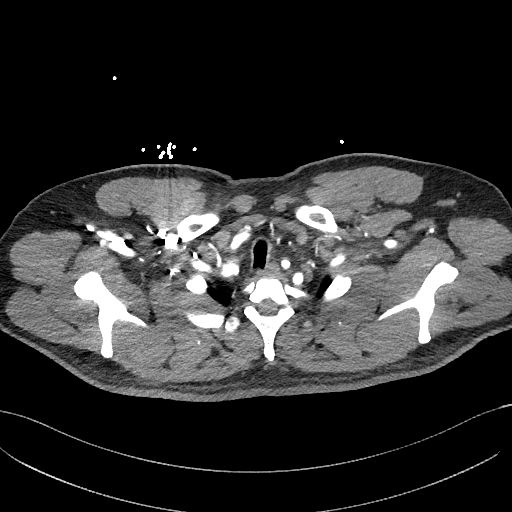

[Series 10: dissection 2mm cor · coronal · 0.69mm/px · 3 of 136 slices shown]
[im 34/136  soft-tissue]
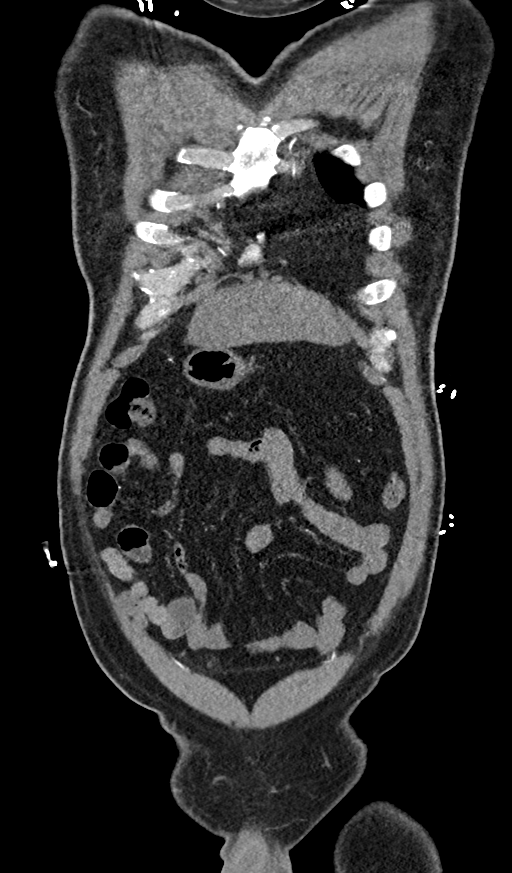
[im 68/136  soft-tissue]
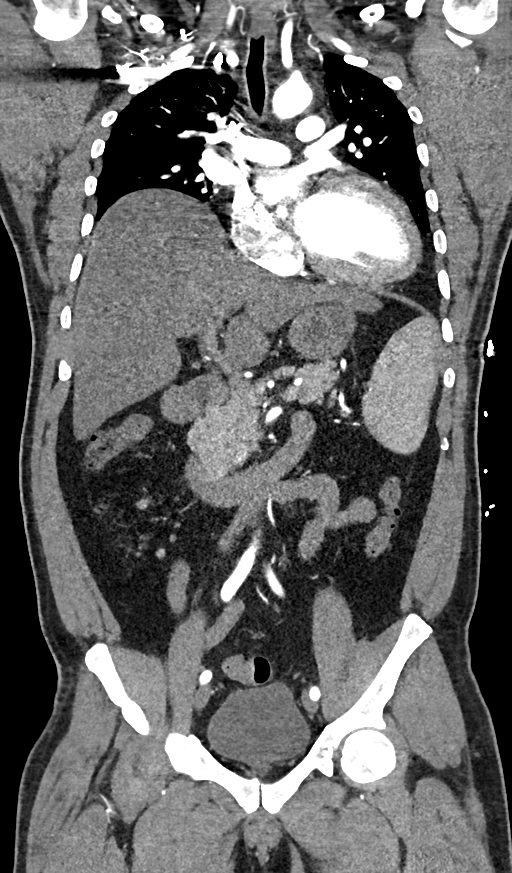
[im 102/136  soft-tissue]
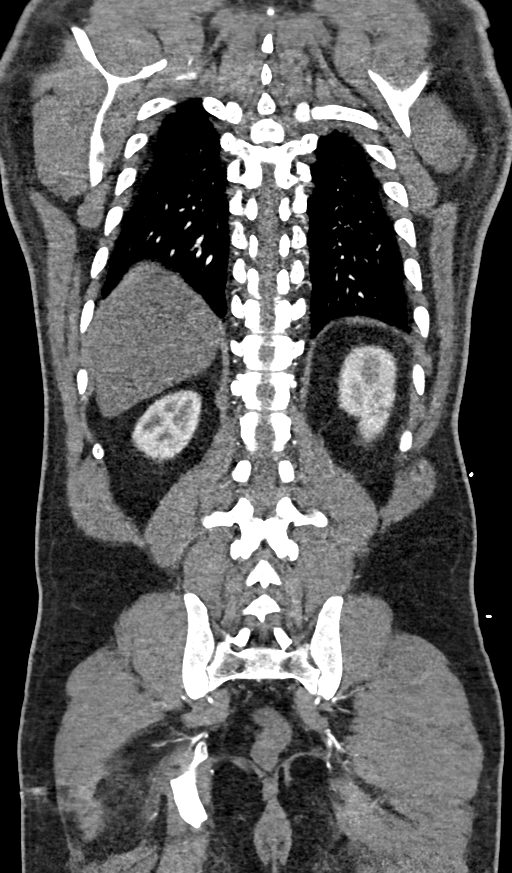

[14 of 46 positions shown; findings below may reference images not displayed]

Multidetector CT imaging through the chest, abdomen and pelvis was
performed using the standard protocol during bolus administration of
intravenous contrast. Multiplanar reconstructed images and MIPs were
obtained and reviewed to evaluate the vascular anatomy.

CONTRAST:  80mL OMNIPAQUE IOHEXOL 350 MG/ML SOLN
FINDINGS: CTA CHEST FINDINGS

Cardiovascular: Thoracic aorta is normal in caliber. There is no
evidence of intramural hematoma or dissection. Normal heart size. No
pericardial effusion.

Mediastinum/Nodes: No enlarged lymph nodes. Thyroid and esophagus
are unremarkable.

Lungs/Pleura: No consolidation or mass. No pleural effusion or
pneumothorax. No central pulmonary embolus.

Musculoskeletal: No acute abnormality.  Spine dictated separately.

Review of the MIP images confirms the above findings.

CTA ABDOMEN AND PELVIS FINDINGS

VASCULAR

Aorta: Normal caliber aorta without aneurysm, dissection, vasculitis
or significant stenosis.

Celiac: Patent without stenosis.

SMA: Patent without stenosis.

Renals: Patent without stenosis.

IMA: Patent without stenosis.

Inflow: Patent.

Veins: Not evaluated.

Review of the MIP images confirms the above findings.

NON-VASCULAR

Hepatobiliary: Steatosis. No focal liver abnormality is seen. No
gallstones, gallbladder wall thickening, or biliary dilatation.

Pancreas: Unremarkable.

Spleen: No new abnormality. Chronic peripheral calcification and
thin subcapsular collection, likely sequelae of remote trauma.

Adrenals/Urinary Tract: Adrenals, kidneys, and bladder unremarkable.

Stomach/Bowel: Stomach is within normal limits. Bowel is normal in
caliber. Normal appendix.

Lymphatic: No enlarged lymph nodes.

Reproductive: Unremarkable.

Other: No free fluid.  Abdominal wall is unremarkable.

Musculoskeletal: No acute abnormality. Spine is dictated separately.

Review of the MIP images confirms the above findings.
IMPRESSION: No evidence of aortic dissection or other acute abnormality.

Hepatic steatosis.
